# Patient Record
Sex: Male | Born: 1985 | Race: Black or African American | Hispanic: No | Marital: Single | Smoking: Former smoker
Health system: Southern US, Community
[De-identification: ages and names within clinical notes are randomized; demographics above are authoritative.]

## PROBLEM LIST (undated history)

## (undated) DIAGNOSIS — T7840XA Allergy, unspecified, initial encounter: Secondary | ICD-10-CM

## (undated) HISTORY — PX: EYE SURGERY: SHX253

## (undated) HISTORY — DX: Allergy, unspecified, initial encounter: T78.40XA

---
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
## 2009-01-28 ENCOUNTER — Observation Stay (HOSPITAL_COMMUNITY): Admission: EM | Admit: 2009-01-28 | Discharge: 2009-01-29 | Payer: Self-pay | Admitting: Emergency Medicine

## 2009-01-28 ENCOUNTER — Encounter: Payer: Self-pay | Admitting: Emergency Medicine

## 2009-06-01 ENCOUNTER — Emergency Department (HOSPITAL_COMMUNITY): Admission: EM | Admit: 2009-06-01 | Discharge: 2009-06-01 | Payer: Self-pay | Admitting: Emergency Medicine

## 2009-08-02 ENCOUNTER — Emergency Department (HOSPITAL_COMMUNITY): Admission: EM | Admit: 2009-08-02 | Discharge: 2009-08-02 | Payer: Self-pay | Admitting: Emergency Medicine

## 2010-01-03 ENCOUNTER — Emergency Department (HOSPITAL_COMMUNITY): Admission: EM | Admit: 2010-01-03 | Discharge: 2010-01-03 | Payer: Self-pay | Admitting: Emergency Medicine

## 2010-09-08 LAB — URINALYSIS, ROUTINE W REFLEX MICROSCOPIC
Bilirubin Urine: NEGATIVE
Glucose, UA: NEGATIVE mg/dL
Hgb urine dipstick: NEGATIVE
Ketones, ur: NEGATIVE mg/dL
Nitrite: NEGATIVE
Protein, ur: NEGATIVE mg/dL
Specific Gravity, Urine: 1.024 (ref 1.005–1.030)
Urobilinogen, UA: 0.2 mg/dL (ref 0.0–1.0)
pH: 6 (ref 5.0–8.0)

## 2010-09-08 LAB — GC/CHLAMYDIA PROBE AMP, GENITAL
Chlamydia, DNA Probe: NEGATIVE
GC Probe Amp, Genital: POSITIVE — AB

## 2010-09-29 LAB — DIFFERENTIAL
Basophils Absolute: 0 10*3/uL (ref 0.0–0.1)
Basophils Relative: 0 % (ref 0–1)
Eosinophils Absolute: 0 10*3/uL (ref 0.0–0.7)
Eosinophils Relative: 0 % (ref 0–5)
Lymphocytes Relative: 10 % — ABNORMAL LOW (ref 12–46)
Lymphs Abs: 2 10*3/uL (ref 0.7–4.0)
Monocytes Absolute: 1.4 10*3/uL — ABNORMAL HIGH (ref 0.1–1.0)
Monocytes Relative: 7 % (ref 3–12)
Neutro Abs: 15.6 10*3/uL — ABNORMAL HIGH (ref 1.7–7.7)
Neutrophils Relative %: 82 % — ABNORMAL HIGH (ref 43–77)

## 2010-09-29 LAB — RAPID URINE DRUG SCREEN, HOSP PERFORMED
Amphetamines: NOT DETECTED
Barbiturates: NOT DETECTED
Benzodiazepines: NOT DETECTED
Cocaine: NOT DETECTED
Opiates: NOT DETECTED
Tetrahydrocannabinol: POSITIVE — AB

## 2010-09-29 LAB — CBC
HCT: 40.4 % (ref 39.0–52.0)
HCT: 41.7 % (ref 39.0–52.0)
Hemoglobin: 13.6 g/dL (ref 13.0–17.0)
Hemoglobin: 14 g/dL (ref 13.0–17.0)
MCHC: 33.5 g/dL (ref 30.0–36.0)
MCHC: 33.6 g/dL (ref 30.0–36.0)
MCV: 93.8 fL (ref 78.0–100.0)
MCV: 94.5 fL (ref 78.0–100.0)
Platelets: 249 10*3/uL (ref 150–400)
Platelets: 251 10*3/uL (ref 150–400)
RBC: 4.28 MIL/uL (ref 4.22–5.81)
RBC: 4.45 MIL/uL (ref 4.22–5.81)
RDW: 13.1 % (ref 11.5–15.5)
RDW: 13.3 % (ref 11.5–15.5)
WBC: 12.9 10*3/uL — ABNORMAL HIGH (ref 4.0–10.5)
WBC: 19 10*3/uL — ABNORMAL HIGH (ref 4.0–10.5)

## 2010-09-29 LAB — POCT I-STAT, CHEM 8
BUN: 9 mg/dL (ref 6–23)
Calcium, Ion: 1.14 mmol/L (ref 1.12–1.32)
Chloride: 103 mEq/L (ref 96–112)
Creatinine, Ser: 1 mg/dL (ref 0.4–1.5)
Glucose, Bld: 86 mg/dL (ref 70–99)
HCT: 43 % (ref 39.0–52.0)
Hemoglobin: 14.6 g/dL (ref 13.0–17.0)
Potassium: 3.3 mEq/L — ABNORMAL LOW (ref 3.5–5.1)
Sodium: 141 mEq/L (ref 135–145)
TCO2: 26 mmol/L (ref 0–100)

## 2010-09-29 LAB — URINALYSIS, ROUTINE W REFLEX MICROSCOPIC
Bilirubin Urine: NEGATIVE
Glucose, UA: NEGATIVE mg/dL
Hgb urine dipstick: NEGATIVE
Ketones, ur: NEGATIVE mg/dL
Nitrite: NEGATIVE
Protein, ur: NEGATIVE mg/dL
Specific Gravity, Urine: 1.041 — ABNORMAL HIGH (ref 1.005–1.030)
Urobilinogen, UA: 1 mg/dL (ref 0.0–1.0)
pH: 7 (ref 5.0–8.0)

## 2010-09-29 LAB — PROTIME-INR
INR: 1.1 (ref 0.00–1.49)
Prothrombin Time: 14.1 seconds (ref 11.6–15.2)

## 2010-09-29 LAB — ETHANOL: Alcohol, Ethyl (B): <5 mg/dL (ref 0–10)

## 2010-09-29 LAB — APTT: aPTT: 30 seconds (ref 24–37)

## 2010-09-29 LAB — LACTIC ACID, PLASMA: Lactic Acid, Venous: 2.4 mmol/L — ABNORMAL HIGH (ref 0.5–2.2)

## 2010-09-29 LAB — TYPE AND SCREEN
ABO/RH(D): B POS
Antibody Screen: NEGATIVE

## 2010-09-29 LAB — ABO/RH: ABO/RH(D): B POS
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
## 2012-05-23 ENCOUNTER — Emergency Department (HOSPITAL_COMMUNITY)
Admission: EM | Admit: 2012-05-23 | Discharge: 2012-05-23 | Disposition: A | Discharge status: Home / Self Care | Payer: Self-pay | Attending: Emergency Medicine | Admitting: Emergency Medicine

## 2012-05-23 ENCOUNTER — Encounter (HOSPITAL_COMMUNITY): Payer: Self-pay | Admitting: *Deleted

## 2012-05-23 DIAGNOSIS — A64 Unspecified sexually transmitted disease: Secondary | ICD-10-CM

## 2012-05-23 DIAGNOSIS — Z202 Contact with and (suspected) exposure to infections with a predominantly sexual mode of transmission: Secondary | ICD-10-CM | POA: Insufficient documentation

## 2012-05-23 DIAGNOSIS — Z113 Encounter for screening for infections with a predominantly sexual mode of transmission: Secondary | ICD-10-CM | POA: Insufficient documentation

## 2012-05-23 LAB — URINALYSIS, ROUTINE W REFLEX MICROSCOPIC
Bilirubin Urine: NEGATIVE
Glucose, UA: NEGATIVE mg/dL
Ketones, ur: NEGATIVE mg/dL
Protein, ur: NEGATIVE mg/dL
Specific Gravity, Urine: 1.026 (ref 1.005–1.030)
Urobilinogen, UA: 1 mg/dL (ref 0.0–1.0)
pH: 6.5 (ref 5.0–8.0)

## 2012-05-23 LAB — URINE MICROSCOPIC-ADD ON

## 2012-05-23 MED ORDER — LIDOCAINE HCL (PF) 1 % IJ SOLN
INTRAMUSCULAR | Status: AC
  Administered 2012-05-23: 2.5 mL
  Filled 2012-05-23: qty 5

## 2012-05-23 MED ORDER — AZITHROMYCIN 250 MG PO TABS
1000.0000 mg | ORAL_TABLET | Freq: Once | ORAL | Status: AC
  Administered 2012-05-23: 1000 mg via ORAL
  Filled 2012-05-23: qty 4

## 2012-05-23 MED ORDER — DOXYCYCLINE HYCLATE 100 MG PO CAPS: 100.0000 mg | ORAL_CAPSULE | Freq: Two times a day (BID) | ORAL | Status: DC

## 2012-05-23 MED ORDER — CEFTRIAXONE SODIUM 250 MG IJ SOLR
250.0000 mg | Freq: Once | INTRAMUSCULAR | Status: AC
  Administered 2012-05-23: 250 mg via INTRAMUSCULAR
  Filled 2012-05-23: qty 250

## 2012-05-23 NOTE — ED Notes (Signed)
PT ambulated with baseline gait; VSS; A&Ox3; no signs of distress; respirations even and unlabored; skin warm and dry; no questions upon discharge.  

## 2012-05-23 NOTE — ED Notes (Signed)
Pt reports that his partner +chlaymdia and he wants to be checked, denies any symptoms.

## 2012-05-24 NOTE — ED Provider Notes (Signed)
History     CSN: [id number]  Arrival date & time 05/23/12  1154   First MD Initiated Contact with Patient 05/23/12 1209      Chief Complaint  Patient presents with  . Exposure to STD    (Consider location/radiation/quality/duration/timing/severity/associated sxs/prior treatment) Patient is a 26 y.o. male presenting with STD exposure. The history is provided by the patient. No language interpreter was used.  Exposure to STD This is a new problem. The current episode started in the past 7 days. The problem occurs rarely. Pertinent negatives include no abdominal pain, fever, nausea or vomiting. Nothing aggravates the symptoms. He has tried nothing for the symptoms.  26yo male with know exposure to chlamydia from his partner with no symptoms today.  Will treat for stds.    History reviewed. No pertinent past medical history.  History reviewed. No pertinent past surgical history.  History reviewed. No pertinent family history.  History  Substance Use Topics  . Smoking status: Not on file  . Smokeless tobacco: Not on file  . Alcohol Use: No      Review of Systems  Constitutional: Negative.  Negative for fever.  HENT: Negative.   Eyes: Negative.   Respiratory: Negative.   Cardiovascular: Negative.   Gastrointestinal: Negative.  Negative for nausea, vomiting and abdominal pain.  Genitourinary: Negative for dysuria, urgency, hematuria, flank pain, penile swelling, difficulty urinating and penile pain.  Neurological: Negative.   Psychiatric/Behavioral: Negative.   All other systems reviewed and are negative.    Allergies  Review of patient's allergies indicates no known allergies.  Home Medications   Current Outpatient Rx  Name  Route  Sig  Dispense  Refill  . DOXYCYCLINE HYCLATE 100 MG PO CAPS   Oral   Take 1 capsule (100 mg total) by mouth 2 (two) times daily.   20 capsule   0     BP 124/78  Pulse 70  Temp 98.3 F (36.8 C) (Oral)  Resp 20  SpO2  100%  Physical Exam  Nursing note and vitals reviewed. Constitutional: He is oriented to person, place, and time. He appears well-developed and well-nourished.  HENT:  Head: Normocephalic.  Eyes: Conjunctivae normal and EOM are normal. Pupils are equal, round, and reactive to light.  Neck: Normal range of motion. Neck supple.  Cardiovascular: Normal rate.   Pulmonary/Chest: Effort normal.  Abdominal: Soft.  Genitourinary: Testes normal and penis normal. Right testis shows no mass, no swelling and no tenderness. Left testis shows no mass, no swelling and no tenderness. Circumcised. No penile erythema or penile tenderness. No discharge found.  Musculoskeletal: Normal range of motion.  Neurological: He is alert and oriented to person, place, and time.  Skin: Skin is warm and dry.  Psychiatric: He has a normal mood and affect.    ED Course  Procedures (including critical care time)  Labs Reviewed  URINALYSIS, ROUTINE W REFLEX MICROSCOPIC - Abnormal; Notable for the following:    Leukocytes, UA SMALL (*)     All other components within normal limits  URINE MICROSCOPIC-ADD ON  GC/CHLAMYDIA PROBE AMP   No results found.   1. STD (male)       MDM  Known STD exposure with treatment in ER.  [facility] up [facility] in 5-10 days.  No intercourse until recheck.  Advised to use condoms and get checked for HIV.  Understands to return for severe pain,n/v. rx for doxycycline.         Remi Haggard, NP  05/24/12 1254 

## 2012-05-25 LAB — GC/CHLAMYDIA PROBE AMP
CT Probe RNA: POSITIVE — AB
GC Probe RNA: NEGATIVE

## 2012-05-25 NOTE — ED Provider Notes (Signed)
Medical screening examination/treatment/procedure(s) were performed by non-physician practitioner and as supervising physician I was immediately available for consultation/collaboration.  Ethelda Chick, MD 05/25/12 [phone]

## 2012-05-28 NOTE — ED Notes (Signed)
+   Chlamydia Patient treated with Rocephin and Zithromax appended per protocol MD.[facility] letter faxed

## 2012-05-29 ENCOUNTER — Telehealth (HOSPITAL_COMMUNITY): Payer: Self-pay | Admitting: Emergency Medicine

## 2012-05-30 ENCOUNTER — Telehealth (HOSPITAL_COMMUNITY): Payer: Self-pay | Admitting: Emergency Medicine

## 2012-05-30 NOTE — ED Notes (Signed)
Unable to contact patient via phone. Sent letter. °

## 2012-06-09 NOTE — ED Notes (Signed)
Patient called back and was informed of results.  

## 2018-08-11 ENCOUNTER — Other Ambulatory Visit: Payer: Self-pay

## 2018-08-11 ENCOUNTER — Encounter (HOSPITAL_COMMUNITY): Payer: Self-pay | Admitting: Emergency Medicine

## 2018-08-11 ENCOUNTER — Emergency Department (HOSPITAL_COMMUNITY): Payer: Self-pay

## 2018-08-11 ENCOUNTER — Emergency Department (HOSPITAL_COMMUNITY)
Admission: EM | Admit: 2018-08-11 | Discharge: 2018-08-11 | Disposition: A | Discharge status: Home / Self Care | Payer: Self-pay | Attending: Emergency Medicine | Admitting: Emergency Medicine

## 2018-08-11 DIAGNOSIS — R1031 Right lower quadrant pain: Secondary | ICD-10-CM | POA: Insufficient documentation

## 2018-08-11 DIAGNOSIS — Z87891 Personal history of nicotine dependence: Secondary | ICD-10-CM | POA: Insufficient documentation

## 2018-08-11 DIAGNOSIS — R112 Nausea with vomiting, unspecified: Secondary | ICD-10-CM | POA: Insufficient documentation

## 2018-08-11 DIAGNOSIS — R197 Diarrhea, unspecified: Secondary | ICD-10-CM | POA: Insufficient documentation

## 2018-08-11 LAB — COMPREHENSIVE METABOLIC PANEL
ALBUMIN: 4.1 g/dL (ref 3.5–5.0)
ALT: 26 U/L (ref 0–44)
AST: 35 U/L (ref 15–41)
Alkaline Phosphatase: 42 U/L (ref 38–126)
Anion gap: 8 (ref 5–15)
BILIRUBIN TOTAL: 1.6 mg/dL — AB (ref 0.3–1.2)
BUN: 8 mg/dL (ref 6–20)
CO2: 27 mmol/L (ref 22–32)
Calcium: 9.6 mg/dL (ref 8.9–10.3)
Chloride: 106 mmol/L (ref 98–111)
Creatinine, Ser: 0.97 mg/dL (ref 0.61–1.24)
GFR calc Af Amer: >60 mL/min (ref >60)
GFR calc non Af Amer: >60 mL/min (ref >60)
GLUCOSE: 91 mg/dL (ref 70–99)
Potassium: 4 mmol/L (ref 3.5–5.1)
Sodium: 141 mmol/L (ref 135–145)
TOTAL PROTEIN: 6.9 g/dL (ref 6.5–8.1)

## 2018-08-11 LAB — CBC
HCT: 43.6 % (ref 39.0–52.0)
Hemoglobin: 14.5 g/dL (ref 13.0–17.0)
MCH: 29.9 pg (ref 26.0–34.0)
MCHC: 33.3 g/dL (ref 30.0–36.0)
MCV: 89.9 fL (ref 80.0–100.0)
PLATELETS: 312 10*3/uL (ref 150–400)
RBC: 4.85 MIL/uL (ref 4.22–5.81)
RDW: 12.3 % (ref 11.5–15.5)
WBC: 7.7 10*3/uL (ref 4.0–10.5)
nRBC: 0 % (ref 0.0–0.2)

## 2018-08-11 LAB — URINALYSIS, ROUTINE W REFLEX MICROSCOPIC
BILIRUBIN URINE: NEGATIVE
Glucose, UA: NEGATIVE mg/dL
HGB URINE DIPSTICK: NEGATIVE
Ketones, ur: NEGATIVE mg/dL
Leukocytes,Ua: NEGATIVE
Nitrite: NEGATIVE
PROTEIN: NEGATIVE mg/dL
pH: 6 (ref 5.0–8.0)

## 2018-08-11 LAB — LIPASE, BLOOD: Lipase: 41 U/L (ref 11–51)

## 2018-08-11 MED ORDER — ONDANSETRON 4 MG PO TBDP: 4.0000 mg | ORAL_TABLET | Freq: Three times a day (TID) | ORAL | 0 refills | Status: DC | PRN

## 2018-08-11 MED ORDER — SODIUM CHLORIDE 0.9% FLUSH
3.0000 mL | Freq: Once | INTRAVENOUS | Status: AC
  Administered 2018-08-11: 3 mL via INTRAVENOUS

## 2018-08-11 MED ORDER — ONDANSETRON HCL 4 MG/2ML IJ SOLN
4.0000 mg | Freq: Once | INTRAMUSCULAR | Status: AC
  Administered 2018-08-11: 4 mg via INTRAVENOUS
  Filled 2018-08-11: qty 2

## 2018-08-11 MED ORDER — IOHEXOL 300 MG/ML  SOLN
100.0000 mL | Freq: Once | INTRAMUSCULAR | Status: AC | PRN
  Administered 2018-08-11: 100 mL via INTRAVENOUS

## 2018-08-11 MED ORDER — SODIUM CHLORIDE 0.9 % IV BOLUS
1000.0000 mL | Freq: Once | INTRAVENOUS | Status: AC
  Administered 2018-08-11: 1000 mL via INTRAVENOUS

## 2018-08-11 NOTE — ED Triage Notes (Signed)
Pt reports nausea, vomiting, and diarrhea for the past 2 days.

## 2018-08-11 NOTE — ED Notes (Signed)
Patient currently at CT scan .  

## 2018-08-11 NOTE — ED Provider Notes (Addendum)
[facility] EMERGENCY DEPARTMENT Provider Note   CSN: [id number] Arrival date & time: 08/11/18  1718  History   Chief Complaint Chief Complaint  Patient presents with  . Nausea  . Emesis  . Diarrhea    HPI Javier Leonard is a 33 y.o. male with no significant past medical history who presents for evaluation of abdominal pain.  Patient states he has had abdominal pain located to the periumbilical region x2 days.  Patient states he woke up this morning and pain was lower in nature, and the right lower quadrant as well as some mild pain in the left lower quadrant.  Is also had multiple episodes of nonbloody, nonbilious emesis as well as nonbloody diarrhea.  Patient states he has not had an appetite for the last 3 days.  Has only been able to tolerate small sips of Sprite.  Denies previous history of abdominal surgeries.  Denies fever, chills, headache, neck pain, neck stiffness, chest pain, shortness of breath, dysuria.  Patient was seen "while I am here can you run my urine for gonorrhea and chlamydia."  Denies testicular pain, pain with bowel movements, penile discharge or penile lesions.  Denies additional aggravating or alleviating factors.  Abdominal pain PTA was an 8/10, however is now a 2/10. Patient also states he uses marijuana frequently and would like outpatient resource for substance abuse. Denies IVDU or alcohol abuse.  History obtained from patient.  No interpreter was used.     HPI  History reviewed. No pertinent past medical history.  There are no active problems to display for this patient.   History reviewed. No pertinent surgical history.      Home Medications    Prior to Admission medications   Medication Sig Start Date End Date Taking? Authorizing Provider  doxycycline (VIBRAMYCIN) 100 MG capsule Take 1 capsule (100 mg total) by mouth 2 (two) times daily. 05/23/12   Jethro Bastos, NP  ondansetron (ZOFRAN ODT) 4 MG disintegrating tablet Take  1 tablet (4 mg total) by mouth every 8 (eight) hours as needed for nausea or vomiting. 08/11/18   Henderly, Britni A, PA-C    Family History No family history on file.  Social History Social History   Tobacco Use  . Smoking status: Former Games developer  . Smokeless tobacco: Never Used  Substance Use Topics  . Alcohol use: No  . Drug use: No     Allergies   Patient has no known allergies.   Review of Systems Review of Systems  Constitutional: Negative.   HENT: Negative.   Respiratory: Negative.   Cardiovascular: Negative.   Gastrointestinal: Positive for diarrhea, nausea and vomiting. Negative for abdominal distention, abdominal pain, anal bleeding, blood in stool, constipation and rectal pain.  Genitourinary: Negative.   Musculoskeletal: Negative.   Skin: Negative.   Neurological: Negative.   All other systems reviewed and are negative.    Physical Exam Updated Vital Signs BP 120/78   Pulse 63   Temp 98 F (36.7 C) (Oral)   Resp 16   Ht 6\' 2"  (1.88 m)   Wt 74.8 kg   SpO2 (!) 88%   BMI 21.18 kg/m   Physical Exam Vitals signs and nursing note reviewed. Exam conducted with a chaperone present.  Constitutional:      General: He is not in acute distress.    Appearance: He is well-developed. He is not ill-appearing, toxic-appearing or diaphoretic.  HENT:     Head: Normocephalic and atraumatic.  Nose: Nose normal.     Mouth/Throat:     Mouth: Mucous membranes are moist.  Eyes:     Pupils: Pupils are equal, round, and reactive to light.  Neck:     Musculoskeletal: Normal range of motion and neck supple.  Cardiovascular:     Rate and Rhythm: Normal rate and regular rhythm.     Pulses: Normal pulses.     Heart sounds: Normal heart sounds.  Pulmonary:     Effort: Pulmonary effort is normal. No respiratory distress.     Comments: Clear to auscultation bilaterally without wheeze, rhonchi rales.  No accessory muscle usage.  Able to speak in full sentences without  difficulty. Abdominal:     General: There is no distension.     Palpations: Abdomen is soft.     Hernia: There is no hernia in the right inguinal area or left inguinal area.     Comments: Soft without rebound or guarding.  There is tenderness palpation to the right lower quadrant.  Positive psoas, negative obturator and Rovsing sign.  Normoactive bowel sounds.  Genitourinary:    Penis: Normal.      Scrotum/Testes: Normal. Cremasteric reflex is present.     Epididymis:     Right: Normal.     Left: Normal.  Musculoskeletal: Normal range of motion.     Comments: Moves all extremities without difficulty.  Ambulatory in department that difficulty.  Lymphadenopathy:     Lower Body: No right inguinal adenopathy. No left inguinal adenopathy.  Skin:    General: Skin is warm and dry.     Comments: Brisk capillary refill.  Neurological:     Mental Status: He is alert.      ED Treatments / Results  Labs (all labs ordered are listed, but only abnormal results are displayed) Labs Reviewed  COMPREHENSIVE METABOLIC PANEL - Abnormal; Notable for the following components:      Result Value   Total Bilirubin 1.6 (*)    All other components within normal limits  LIPASE, BLOOD  CBC  URINALYSIS, ROUTINE W REFLEX MICROSCOPIC  GC/CHLAMYDIA PROBE AMP ([place]) NOT AT [facility]    EKG None  Radiology Ct Abdomen Pelvis W Contrast  Result Date: 08/11/2018 CLINICAL DATA:  33 y/o M; nausea, vomiting, and diarrhea for 2 days. EXAM: CT ABDOMEN AND PELVIS WITH CONTRAST TECHNIQUE: Multidetector CT imaging of the abdomen and pelvis was performed using the standard protocol following bolus administration of intravenous contrast. CONTRAST:  OMNIPAQUE IOHEXOL 300 MG/ML  SOLN COMPARISON:  01/28/2009 CT abdomen and pelvis. FINDINGS: Lower chest: Right lower lobe 3 mm perifissural nodule compatible with intrapulmonary lymph node. Hepatobiliary: No focal liver abnormality is seen. No gallstones, gallbladder  wall thickening, or biliary dilatation. Pancreas: Unremarkable. No pancreatic ductal dilatation or surrounding inflammatory changes. Spleen: Normal in size without focal abnormality. Adrenals/Urinary Tract: Adrenal glands are unremarkable. 7 mm cyst in the lower pole of right kidney is stable. Otherwise kidneys are normal, without renal calculi, focal lesion, or hydronephrosis. Bladder is unremarkable. Stomach/Bowel: Stomach is within normal limits. Appendix not identified, no pericecal inflammation. No evidence of bowel wall thickening, distention, or inflammatory changes. Vascular/Lymphatic: No significant vascular findings are present. No enlarged abdominal or pelvic lymph nodes. Reproductive: Prostate is unremarkable. Other: No abdominal wall hernia or abnormality. No abdominopelvic ascites. Musculoskeletal: No acute or significant osseous findings. IMPRESSION: No acute process identified.  Unremarkable CT of abdomen and pelvis. Electronically Signed   By: Mitzi Hansen M.D.   On:  08/11/2018 19:55    Procedures Procedures (including critical care time)  Medications Ordered in ED Medications  sodium chloride flush (NS) 0.9 % injection 3 mL (3 mLs Intravenous Given 08/11/18 1910)  sodium chloride 0.9 % bolus 1,000 mL (0 mLs Intravenous Stopped 08/11/18 2107)  ondansetron (ZOFRAN) injection 4 mg (4 mg Intravenous Given 08/11/18 1910)  iohexol (OMNIPAQUE) 300 MG/ML solution 100 mL (100 mLs Intravenous Contrast Given 08/11/18 1933)     Initial Impression / Assessment and Plan / ED Course  I have reviewed the triage vital signs and the nursing notes.  Pertinent labs & imaging results that were available during my care of the patient were reviewed by me and considered in my medical decision making (see chart for details).  33 year old male who appears otherwise well presents for evaluation of abdominal pain.  Afebrile, nonseptic, non-ill-appearing.  Mucous membranes moist.  Abdominal  tenderness to right lower quadrant, positive psoas, negative obturator Rovsing sign.  No rebound or guarding.  Pain a 2/10, does not want anything for pain at this time.  Is also had multiple episodes of nonbloody, nonbilious emesis and nonbloody diarrhea.  No recent travel or antibiotic use.  Possible gastroenteritis versus early appendicitis.  Labs ordered from triage.  BC without leukocytosis, lipase 41, metabolic panel with mild elevation in bilirubin, normal LFTs, no renal, liver, electrolyte abnormalities.  Patient requesting urine to be tested for gonorrhea and chlamydia.  No pain with bowel movements, testicular pain to indicate prostatitis, orchitis.  Declines empiric therapy for gonorrhea and chlamydia, does not want HIV and syphilis testing.  Give fluids, antiemetics, CT scan to r/o appendicitis and reevaluate.  Also requesting detox programs for marijuana.  Will give resources at discharge.  2000: CT scan negative for acute pathology.  Patient with improvement in nausea after Zofran and fluids.  Patient able to tolerate p.o. intake in department that difficulty.  Patient unable to provide urine sample.  We will continue to monitor.  On reevaluation abdomen soft, nontender without rebound or guarding.  Low suspicion for acute emergent pathology at this time.  Patient with documented oxygen saturation 88% in room,however I evaluated patient with 100% percent oxygen saturation on RA, do not feel this number is accurate.   2100: Patient is nontoxic, nonseptic appearing, in no apparent distress.  Patient's pain and other symptoms adequately managed in emergency department.  Fluid bolus given.  Labs, imaging and vitals reviewed.  Patient does not meet the SIRS or Sepsis criteria.  On repeat exam patient does not have a surgical abdomin and there are no peritoneal signs.  No indication of appendicitis, bowel obstruction, bowel perforation, cholecystitis, diverticulitis.  Patient able to tolerate p.o. intake  in department without difficulty.  Urinalysis pending at discharge.  Patient also having GC, chlamydia screen from urine.  Patient understands that these results will take 24 to 48 hours to return.  Does not want empiric treatment at this time.  If urinalysis negative patient likely to DC home with Zofran for nausea and vomiting.  I have discussed BRAT diet with patient.  Low suspicion for acute emergent pathology at this time.  Care transferred to Dr. Rodena Medin at shift change pending urinalysis.  Hemodynamically stable at this time.     Final Clinical Impressions(s) / ED Diagnoses   Final diagnoses:  Right lower quadrant abdominal pain  Nausea vomiting and diarrhea    ED Discharge Orders         Ordered    ondansetron (ZOFRAN ODT) 4  MG disintegrating tablet  Every 8 hours PRN     08/11/18 2030             Henderly, Britni A, PA-C 08/11/18 2110    Wynetta FinesMessick, Peter C, MD 08/19/18 [phone]

## 2018-08-11 NOTE — Discharge Instructions (Addendum)
You were evaluated today for abdominal pain, vomiting and diarrhea.  Your laboratory work was unremarkable in department.  Your CT scan was negative.  This is likely a viral infection.  I have given you Zofran for your vomiting.  This is a disintegrating tablet that you have placed under your tongue.  Please take as needed.  I would start out with a clear liquid diet and gradually increase to bland foods over the next couple days.  Please make sure to adequately hydrate you do not become dehydrated.  Also given you resources for detox from marijuana as you have asked.  Return to the emergency department for any worsening symptoms.

## 2018-08-13 LAB — GC/CHLAMYDIA PROBE AMP (~~LOC~~) NOT AT ARMC
CHLAMYDIA, DNA PROBE: NEGATIVE
NEISSERIA GONORRHEA: NEGATIVE

## 2020-07-06 IMAGING — CT CT ABD-PELV W/ CM
2 of 4 series · 16 of 46 positions shown, 18 images · IV contrast (APPLIED)
Comparison: 01/28/2009 CT abdomen and pelvis.

CLINICAL DATA: 32 y/o M; nausea, vomiting, and diarrhea for 2 days.

EXAM:
CT ABDOMEN AND PELVIS WITH CONTRAST
TECHNIQUE: Multidetector CT imaging of the abdomen and pelvis was performed
using the standard protocol following bolus administration of
intravenous contrast.
CONTRAST:  100mL OMNIPAQUE IOHEXOL 300 MG/ML  SOLN

[Series 3: abd/ pelvis 5.0 i30f 2 · axial · 0.65mm/px · z∈[+909,+1294]mm · 13 of 85 slices shown, 15 images]
[im 4/85  soft-tissue]
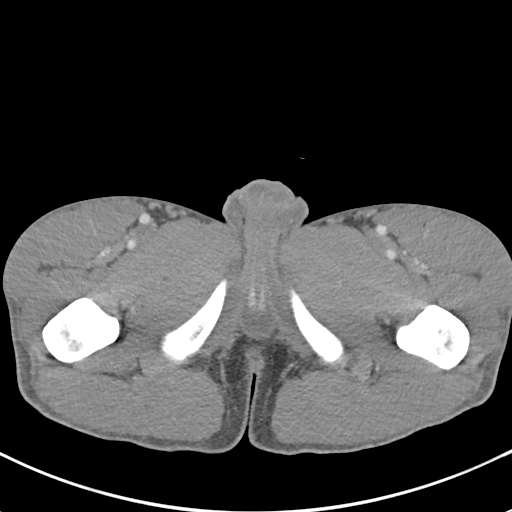
[im 4/85  bone]
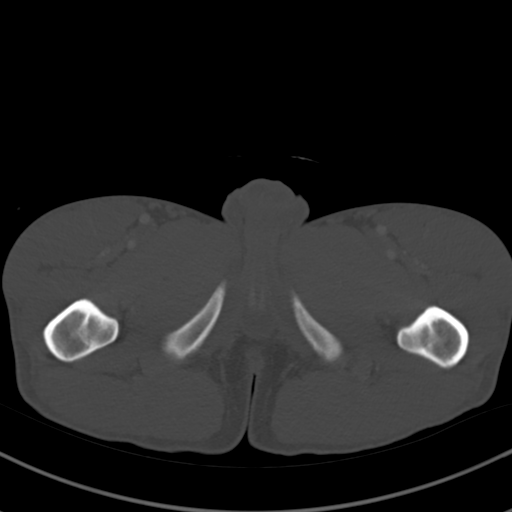
[im 11/85  soft-tissue]
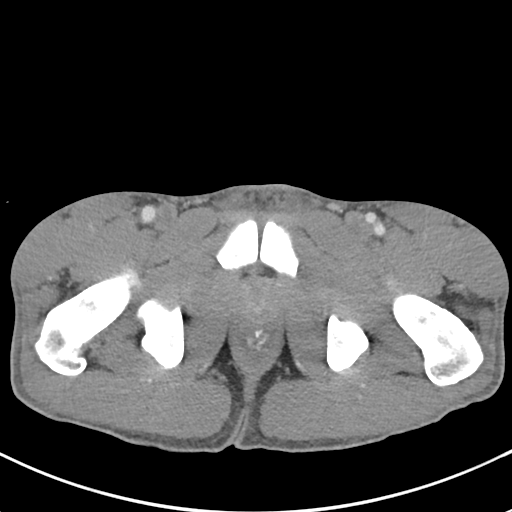
[im 17/85  soft-tissue]
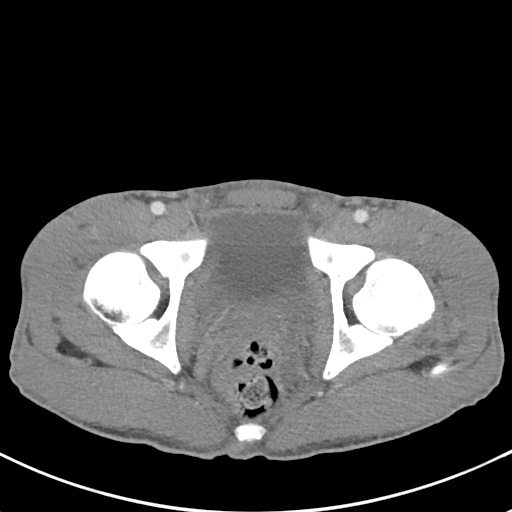
[im 24/85  soft-tissue]
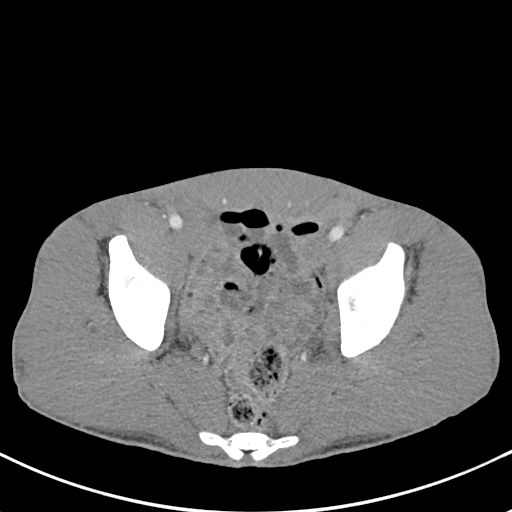
[im 31/85  soft-tissue]
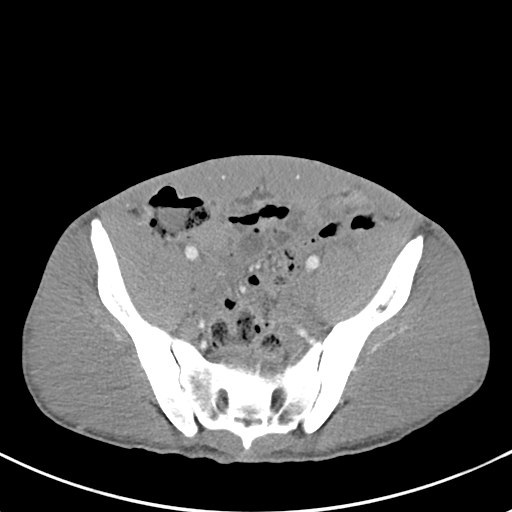
[im 37/85  soft-tissue]
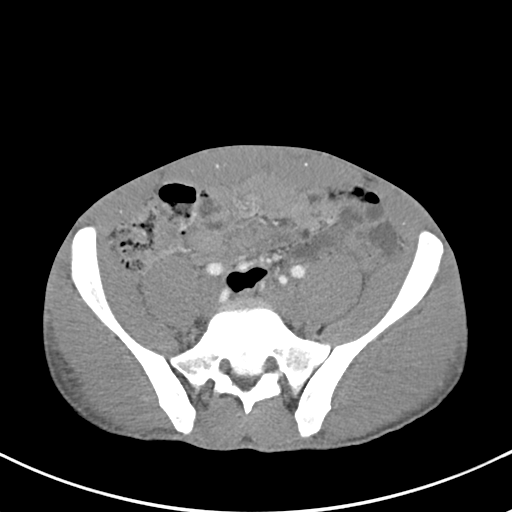
[im 44/85  soft-tissue]
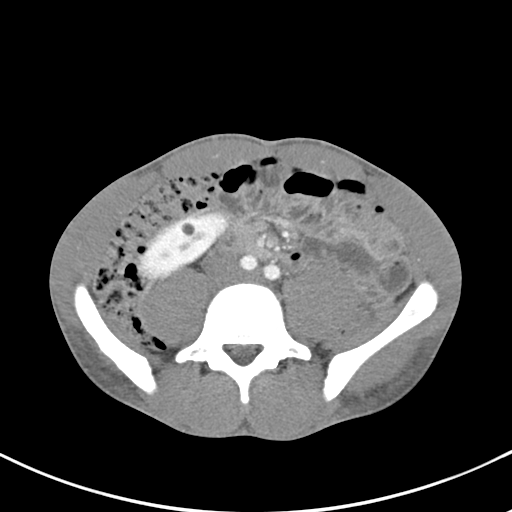
[im 48/85  soft-tissue]
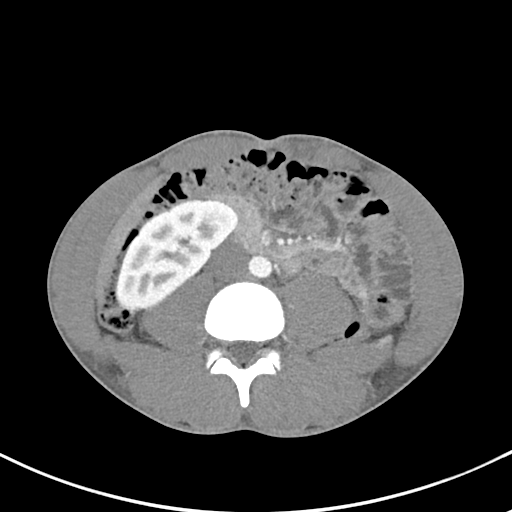
[im 54/85  soft-tissue]
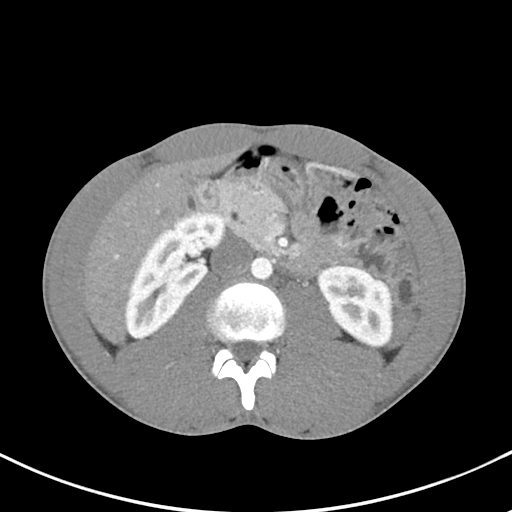
[im 54/85  bone]
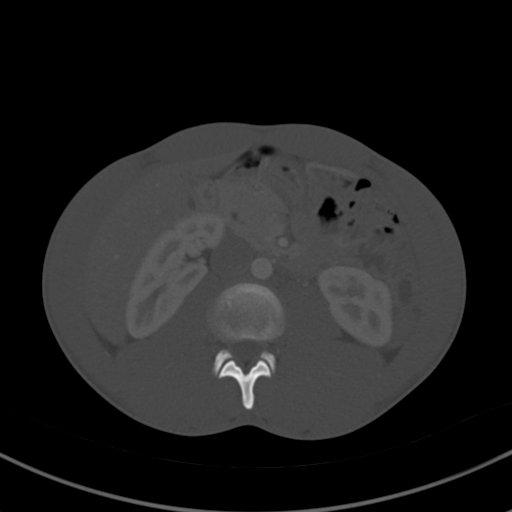
[im 61/85  soft-tissue]
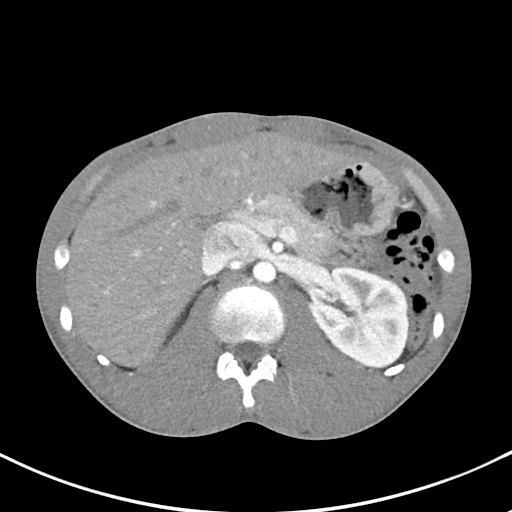
[im 68/85  soft-tissue]
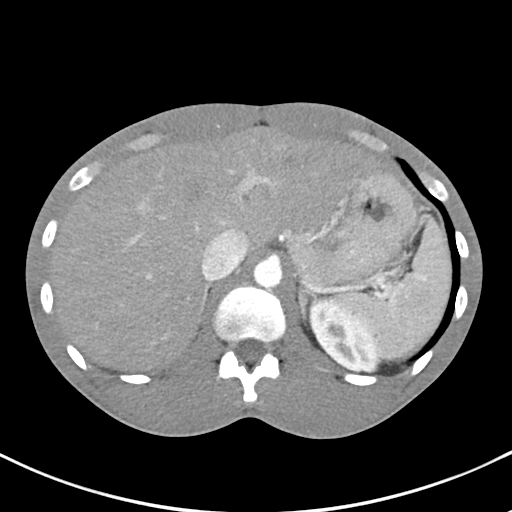
[im 74/85  soft-tissue]
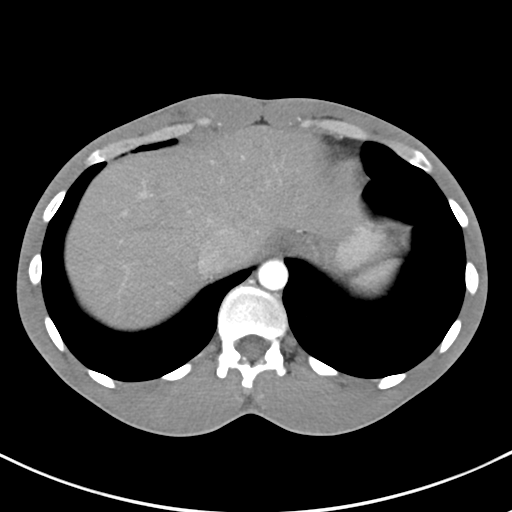
[im 81/85  soft-tissue]
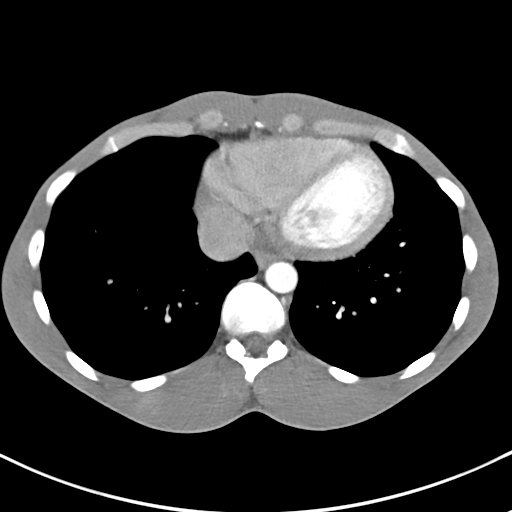

[Series 6: coronal soft tissue · coronal · 0.69mm/px · 3 of 92 slices shown]
[im 31/92  soft-tissue]
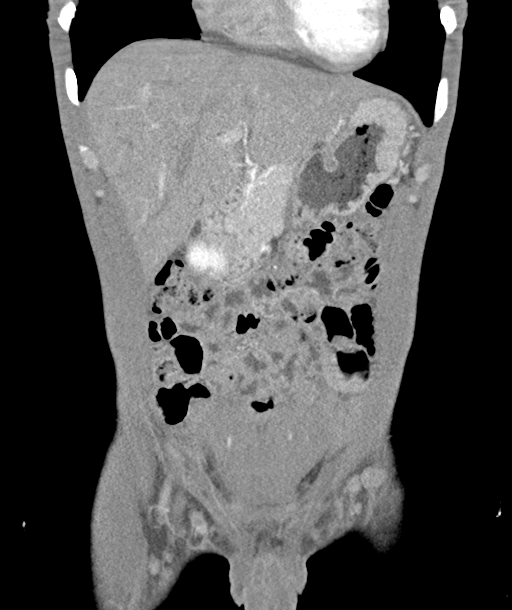
[im 41/92  soft-tissue]
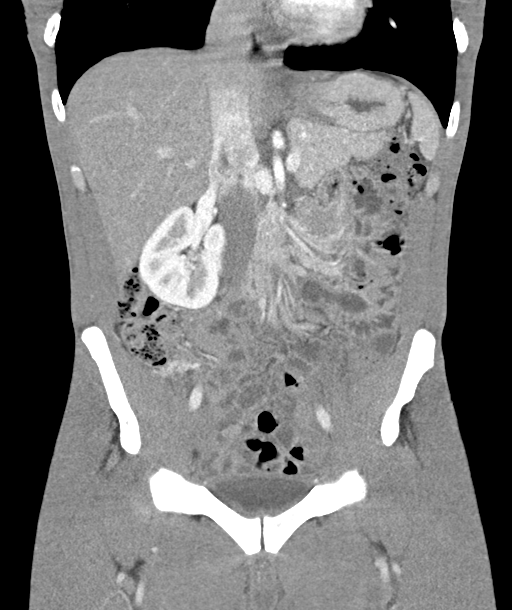
[im 51/92  soft-tissue]
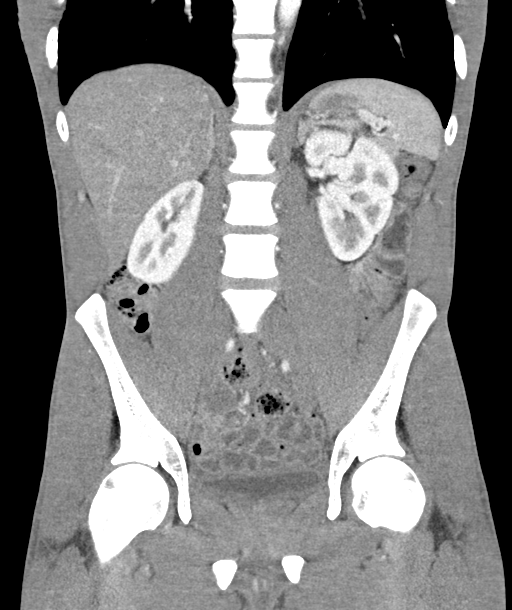

[16 of 46 positions shown; findings below may reference images not displayed]

FINDINGS: Lower chest: Right lower lobe 3 mm perifissural nodule compatible
with intrapulmonary lymph node.

Hepatobiliary: No focal liver abnormality is seen. No gallstones,
gallbladder wall thickening, or biliary dilatation.

Pancreas: Unremarkable. No pancreatic ductal dilatation or
surrounding inflammatory changes.

Spleen: Normal in size without focal abnormality.

Adrenals/Urinary Tract: Adrenal glands are unremarkable. 7 mm cyst
in the lower pole of right kidney is stable. Otherwise kidneys are
normal, without renal calculi, focal lesion, or hydronephrosis.
Bladder is unremarkable.

Stomach/Bowel: Stomach is within normal limits. Appendix not
identified, no pericecal inflammation. No evidence of bowel wall
thickening, distention, or inflammatory changes.

Vascular/Lymphatic: No significant vascular findings are present. No
enlarged abdominal or pelvic lymph nodes.

Reproductive: Prostate is unremarkable.

Other: No abdominal wall hernia or abnormality. No abdominopelvic
ascites.

Musculoskeletal: No acute or significant osseous findings.
IMPRESSION: No acute process identified.  Unremarkable CT of abdomen and pelvis.

## 2021-04-03 ENCOUNTER — Other Ambulatory Visit: Payer: Self-pay

## 2021-04-03 ENCOUNTER — Encounter (HOSPITAL_COMMUNITY): Payer: Self-pay

## 2021-04-03 ENCOUNTER — Emergency Department (HOSPITAL_COMMUNITY): Payer: Self-pay

## 2021-04-03 ENCOUNTER — Emergency Department (HOSPITAL_COMMUNITY)
Admission: EM | Admit: 2021-04-03 | Discharge: 2021-04-03 | Disposition: A | Discharge status: Home / Self Care | Payer: Self-pay | Attending: Emergency Medicine | Admitting: Emergency Medicine

## 2021-04-03 DIAGNOSIS — M25531 Pain in right wrist: Secondary | ICD-10-CM | POA: Insufficient documentation

## 2021-04-03 DIAGNOSIS — G5603 Carpal tunnel syndrome, bilateral upper limbs: Secondary | ICD-10-CM | POA: Insufficient documentation

## 2021-04-03 DIAGNOSIS — Z87891 Personal history of nicotine dependence: Secondary | ICD-10-CM | POA: Insufficient documentation

## 2021-04-03 NOTE — ED Provider Notes (Signed)
[facility]-EMERGENCY DEPT Provider Note   CSN: [id number] Arrival date & time: 04/03/21  1614     History Chief Complaint  Patient presents with   hand numbness   Hand Pain   Wrist Pain    Javier Leonard is a 35 y.o. male with no pertinent past medical history who presents today for evaluation of bilateral hand numbness.  This has been intermittent over the past few years however has become worse recently.  He reports he does repetitive hand movements for work. He denies any fevers or specific injury.  His pain is worse in the morning when he wakes up.  Pain is worse in the right thumb.   HPI     History reviewed. No pertinent past medical history.  There are no problems to display for this patient.   Past Surgical History:  Procedure Laterality Date   EYE SURGERY         History reviewed. No pertinent family history.  Social History   Tobacco Use   Smoking status: Former   Smokeless tobacco: Never  Building services engineer Use: Never used  Substance Use Topics   Alcohol use: No   Drug use: No    Home Medications Prior to Admission medications   Medication Sig Start Date End Date Taking? Authorizing Provider  ondansetron (ZOFRAN ODT) 4 MG disintegrating tablet Take 1 tablet (4 mg total) by mouth every 8 (eight) hours as needed for nausea or vomiting. 08/11/18   Henderly, Britni A, PA-C    Allergies    Patient has no known allergies.  Review of Systems   Review of Systems  Constitutional:  Negative for chills and fever.  Eyes:  Negative for visual disturbance.  Neurological:  Positive for numbness.  All other systems reviewed and are negative.  Physical Exam Updated Vital Signs BP 123/70   Pulse 86   Temp 98.9 F (37.2 C) (Oral)   Resp 18   Ht 6\' 2"  (1.88 m)   Wt 74.8 kg   SpO2 99%   BMI 21.18 kg/m   Physical Exam Vitals and nursing note reviewed.  Constitutional:      General: He is not in acute distress. HENT:      Head: Normocephalic and atraumatic.  Cardiovascular:     Rate and Rhythm: Normal rate.     Comments: 2+ radial pulses bilaterally. Pulmonary:     Effort: Pulmonary effort is normal. No respiratory distress.  Musculoskeletal:     Cervical back: No rigidity.     Comments: Patient has full active range of motion of fingers on bilateral hands.   Pain with percussion over the carpal tunnel.   Skin:    Comments: Warm and dry  Neurological:     Mental Status: He is alert. Mental status is at baseline.     Sensory: Sensory deficit (Decree sensation to light touch over fingers thumb, index, long and radial aspect of the ring finger bilaterally) present.     Motor: No weakness.     Comments: Awake and alert, answers all questions appropriately.  Speech is not slurred.    Psychiatric:        Mood and Affect: Mood normal.        Behavior: Behavior normal.    ED Results / Procedures / Treatments   Labs (all labs ordered are listed, but only abnormal results are displayed) Labs Reviewed - No data to display  EKG None  Radiology DG Hand Complete Right  Result Date: 04/03/2021 CLINICAL DATA:  Hand pain for awhile, repetitive job motion EXAM: RIGHT HAND - COMPLETE 3+ VIEW COMPARISON:  None FINDINGS: Osseous mineralization normal. Joint spaces preserved. No acute fracture, dislocation, or bone destruction. IMPRESSION: Normal exam. Electronically Signed   By: Ulyses Southward M.D.   On: 04/03/2021 18:14    Procedures Procedures   Medications Ordered in ED Medications - No data to display  ED Course  I have reviewed the triage vital signs and the nursing notes.  Pertinent labs & imaging results that were available during my care of the patient were reviewed by me and considered in my medical decision making (see chart for details).    MDM Rules/Calculators/A&P                          Patient is a 35 year old man who presents today for evaluation of pain and numbness in his bilateral hands.   This has been present for about 2 years however is worsened significantly recently.  He denies any fevers or injury.  His pain is consistent with carpal tunnel syndrome.  He will be provided braces with instructions to use these at night every night and during the day as needed.  Recommended hand follow-up.  OTC meds as needed for pain including NSAIDs.  Note: Portions of this report may have been transcribed using voice recognition software. Every effort was made to ensure accuracy; however, inadvertent computerized transcription errors may be present   Final Clinical Impression(s) / ED Diagnoses Final diagnoses:  Bilateral carpal tunnel syndrome    Rx / DC Orders ED Discharge Orders     None        Norman Clay 04/03/21 1941    Linwood Dibbles, MD 04/04/21 1429

## 2021-04-03 NOTE — Discharge Instructions (Addendum)
Please wear the braces every night to help keep yourself from bending your wrist during your sleep. You may choose to wear them during the day also to see if that helps. As we discussed I highly recommend that you see a hand specialist to see if you require surgery.  Please take Ibuprofen (Advil, motrin) and Tylenol (acetaminophen) to relieve your pain.    You may take up to 600 MG (3 pills) of normal strength ibuprofen every 8 hours as needed.   You make take tylenol, up to 1,000 mg (two extra strength pills) every 8 hours as needed.   It is safe to take ibuprofen and tylenol at the same time as they work differently.   Do not take more than 3,000 mg tylenol in a 24 hour period (not more than one dose every 8 hours.  Please check all medication labels as many medications such as pain and cold medications may contain tylenol.  Do not drink alcohol while taking these medications.  Do not take other NSAID'S while taking ibuprofen (such as aleve or naproxen).  Please take ibuprofen with food to decrease stomach upset.

## 2021-04-03 NOTE — ED Triage Notes (Signed)
Patient reports that he has had intermittent bilateral hand numbness and pain x 2 years. Patient sates he has always had to do repetitive movement with his hands for his job. Patient states the pain has worsened in the past few days. Patient states his right thumb is very painful today.

## 2021-04-03 NOTE — ED Provider Notes (Signed)
Emergency Medicine Provider Triage Evaluation Note  Javier Leonard , a 35 y.o. male  was evaluated in triage.  Pt complains of intermittent hand pain and numbness.  He has had this intermittently for the past two years.  He done repetitive movements with his hands for work.  He says that his right thumb has been extra painful today.    Review of Systems  Positive: Pain and numbness in bilateral hands Negative: Fevers  Physical Exam  BP 123/70   Pulse 86   Temp 98.9 F (37.2 C) (Oral)   Resp 18   Ht 6\' 2"  (1.88 m)   Wt 74.8 kg   SpO2 99%   BMI 21.18 kg/m  Gen:   Awake, no distress   Resp:  Normal effort  MSK:   Moves extremities without difficulty  Other:  Pain with movements of the right hand and thumb.  TTP over carpal tunnel bilaterally.   Medical Decision Making  Medically screening exam initiated at 5:28 PM.  Appropriate orders placed.  Javier Leonard was informed that the remainder of the evaluation will be completed by another provider, this initial triage assessment does not replace that evaluation, and the importance of remaining in the ED until their evaluation is complete.     Javier Leonard 04/03/21 1736    06/03/21, MD 04/03/21 06/03/21

## 2021-04-14 ENCOUNTER — Encounter (HOSPITAL_COMMUNITY): Payer: Self-pay | Admitting: Emergency Medicine

## 2021-04-14 ENCOUNTER — Emergency Department (HOSPITAL_COMMUNITY)
Admission: EM | Admit: 2021-04-14 | Discharge: 2021-04-14 | Disposition: A | Discharge status: Home / Self Care | Payer: Self-pay | Attending: Emergency Medicine | Admitting: Emergency Medicine

## 2021-04-14 DIAGNOSIS — G5603 Carpal tunnel syndrome, bilateral upper limbs: Secondary | ICD-10-CM | POA: Insufficient documentation

## 2021-04-14 DIAGNOSIS — Z87891 Personal history of nicotine dependence: Secondary | ICD-10-CM | POA: Insufficient documentation

## 2021-04-14 MED ORDER — PREDNISONE 10 MG PO TABS: 40.0000 mg | ORAL_TABLET | Freq: Every day | ORAL | 0 refills | Status: DC

## 2021-04-14 NOTE — ED Triage Notes (Signed)
States he has bilateral wrist pain, needs to schedule carpal tunnel sx. Has been taking Naproxen every few hours, states he needs something stronger.

## 2021-04-14 NOTE — ED Provider Notes (Signed)
[facility]-EMERGENCY DEPT Provider Note   CSN: [id number] Arrival date & time: 04/14/21  1805     History No chief complaint on file.   Javier Leonard is a 35 y.o. male who presents to the emergency department with bilateral wrist pain and for second and third numbness to the bilateral hands that has been chronic for some time.  Patient was seen and evaluated here on 12 October and was diagnosed with bilateral carpal tunnels.  He was instructed to follow-up with orthopedics for possible surgical evaluation.  He is fearful as he been taking a lot of anti-inflammatories and wants to ensure that this is okay and wants additional pain medication.  He denies any new symptoms, fever, chills, or injury to the wrist.  HPI     History reviewed. No pertinent past medical history.  There are no problems to display for this patient.   Past Surgical History:  Procedure Laterality Date   EYE SURGERY         No family history on file.  Social History   Tobacco Use   Smoking status: Former   Smokeless tobacco: Never  Building services engineer Use: Never used  Substance Use Topics   Alcohol use: No   Drug use: No    Home Medications Prior to Admission medications   Medication Sig Start Date End Date Taking? Authorizing Provider  predniSONE (DELTASONE) 10 MG tablet Take 4 tablets (40 mg total) by mouth daily. 04/14/21  Yes Meredeth Ide, Arina Torry M, PA-C  ondansetron (ZOFRAN ODT) 4 MG disintegrating tablet Take 1 tablet (4 mg total) by mouth every 8 (eight) hours as needed for nausea or vomiting. 08/11/18   Henderly, Britni A, PA-C    Allergies    Patient has no known allergies.  Review of Systems   Review of Systems  All other systems reviewed and are negative.  Physical Exam Updated Vital Signs BP 105/60 (BP Location: Left Arm)   Pulse 93   Temp 98.2 F (36.8 C) (Oral)   Resp 15   SpO2 98%   Physical Exam Vitals and nursing note reviewed.  Constitutional:       Appearance: Normal appearance.  HENT:     Head: Normocephalic and atraumatic.  Eyes:     General:        Right eye: No discharge.        Left eye: No discharge.     Conjunctiva/sclera: Conjunctivae normal.  Pulmonary:     Effort: Pulmonary effort is normal.  Musculoskeletal:     Comments: Bilateral wrists are and splints.  Subjective numbness to the first second and third fingers bilaterally.  Hands have good capillary refill.  Skin:    General: Skin is warm and dry.     Findings: No rash.  Neurological:     General: No focal deficit present.     Mental Status: He is alert.  Psychiatric:        Mood and Affect: Mood normal.        Behavior: Behavior normal.    ED Results / Procedures / Treatments   Labs (all labs ordered are listed, but only abnormal results are displayed) Labs Reviewed - No data to display  EKG None  Radiology No results found.  Procedures Procedures   Medications Ordered in ED Medications - No data to display  ED Course  I have reviewed the triage vital signs and the nursing notes.  Pertinent labs & imaging results that were  available during my care of the patient were reviewed by me and considered in my medical decision making (see chart for details).    MDM Rules/Calculators/A&P                          Javier Leonard is a 35 y.o. male presents the emergency department for for evaluation of bilateral carpal tunnel syndrome.  Given that this is not acutely changed or worsened since his last visit I do not feel additional imaging or work-up is necessary at this time.  I will prescribe the patient on steroids for 5 days and attempt to bring down inflammation as he is still quite symptomatic.  His plan is to follow-up with orthopedics sometime next week.  He will call them tomorrow.  I also instructed the patient that the anti-inflammatories are of benefit and he can continue taking them as long as he does not have any other associated symptoms.   All questions or concerns addressed.   Final Clinical Impression(s) / ED Diagnoses Final diagnoses:  Bilateral carpal tunnel syndrome    Rx / DC Orders ED Discharge Orders          Ordered    predniSONE (DELTASONE) 10 MG tablet  Daily        04/14/21 1847             Teressa Lower, PA-C 04/14/21 1849    Sloan Leiter, DO 04/14/21 2343

## 2023-02-27 IMAGING — CR DG HAND COMPLETE 3+V*R*
3 series · 3 of 3 positions shown · non-contrast
Comparison: None

CLINICAL DATA: Hand pain for awhile, repetitive job motion

EXAM:
RIGHT HAND - COMPLETE 3+ VIEW

[x hand pa right]
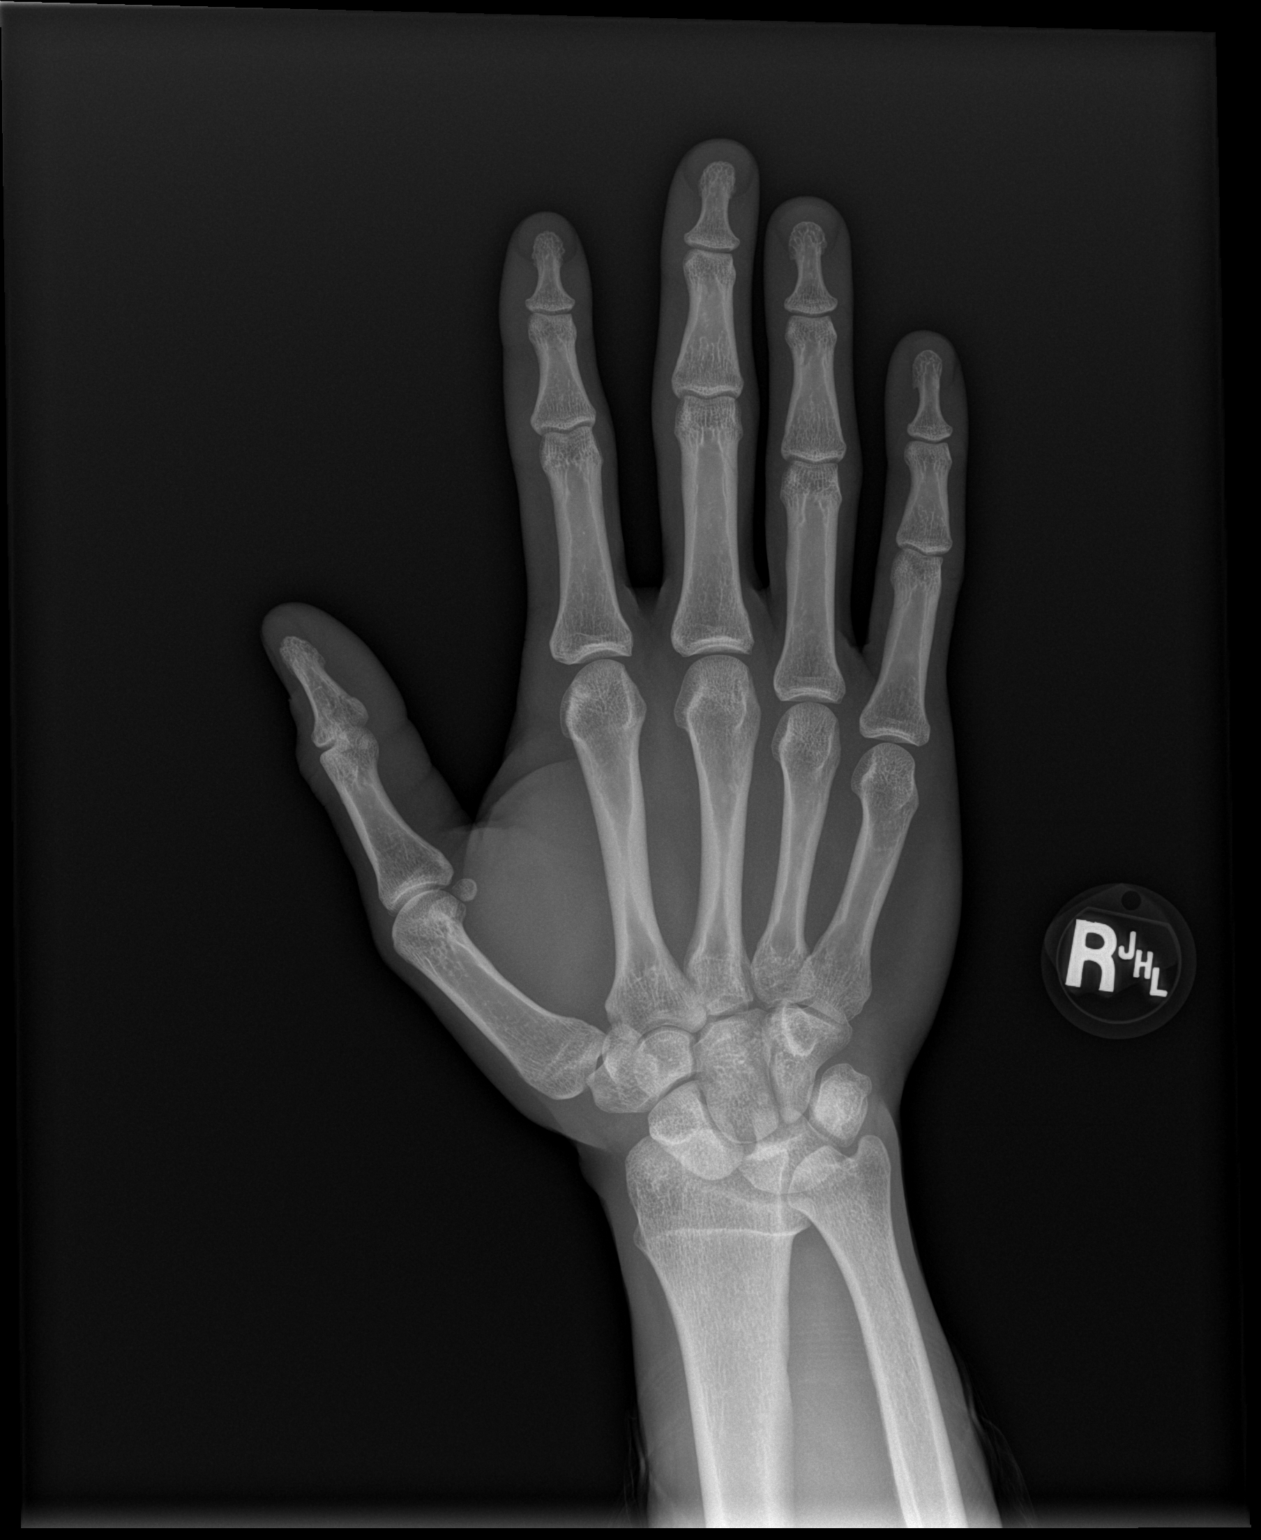

[x hand obl right]
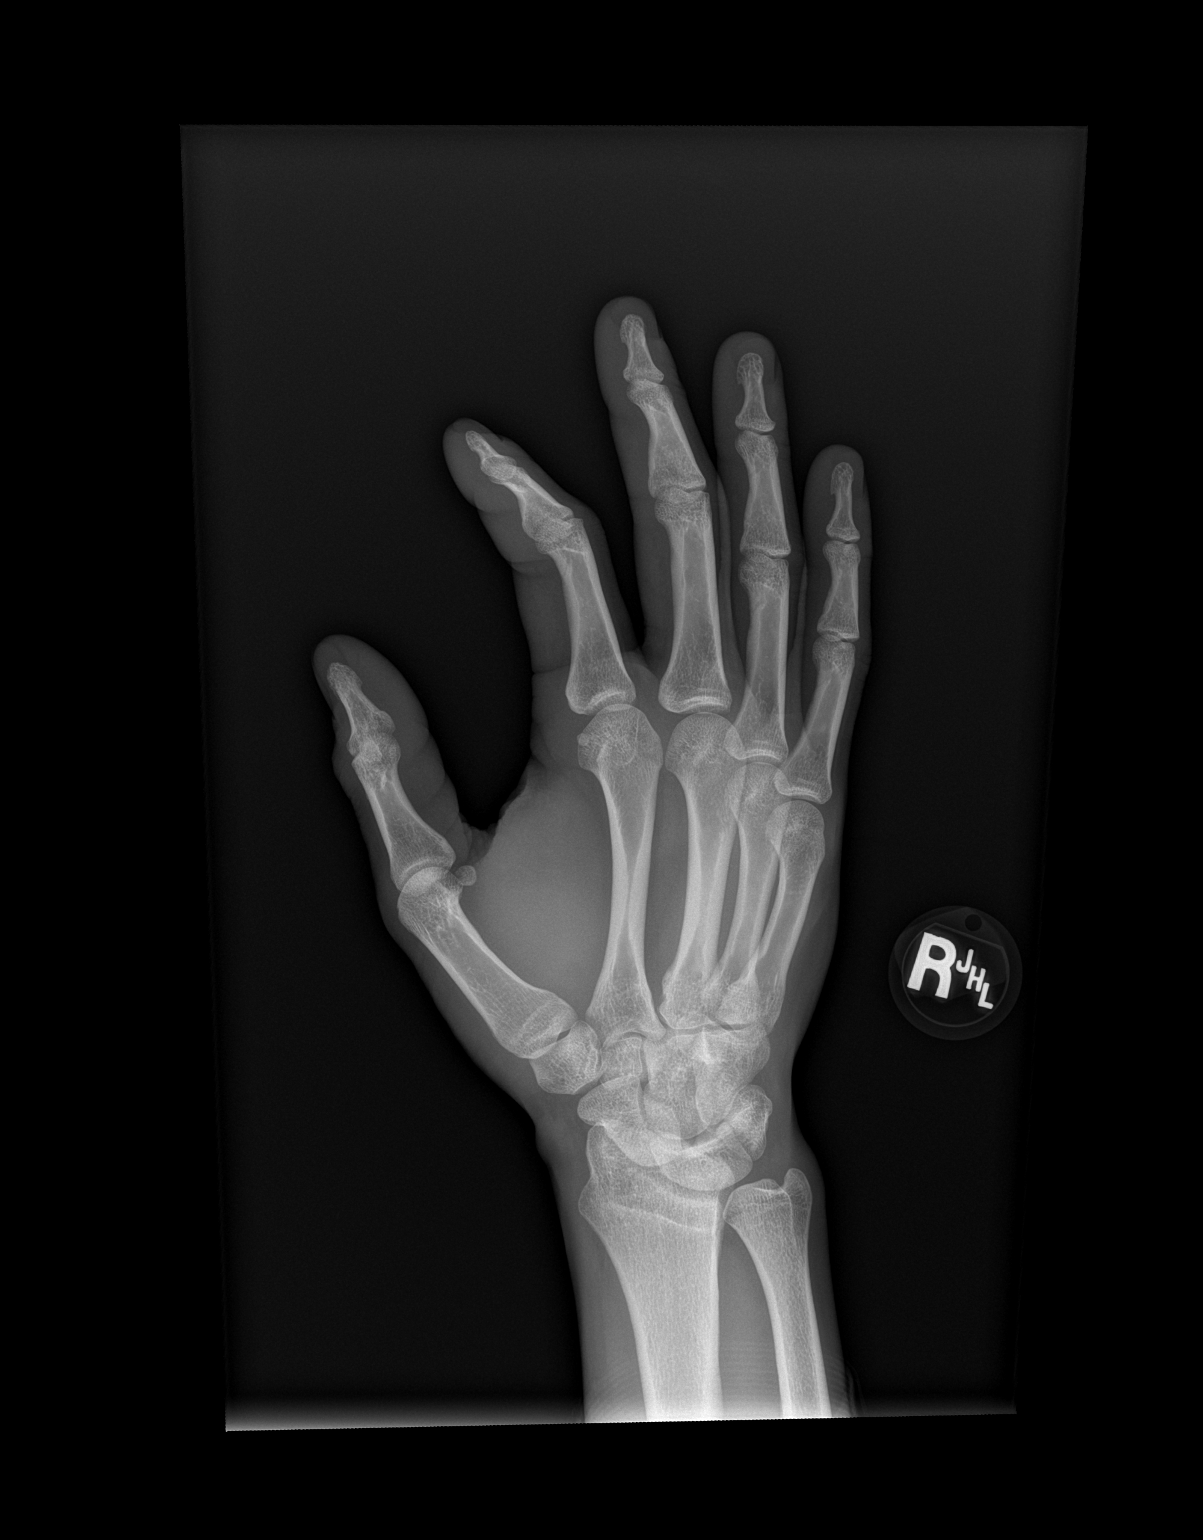

[x hand lat right]
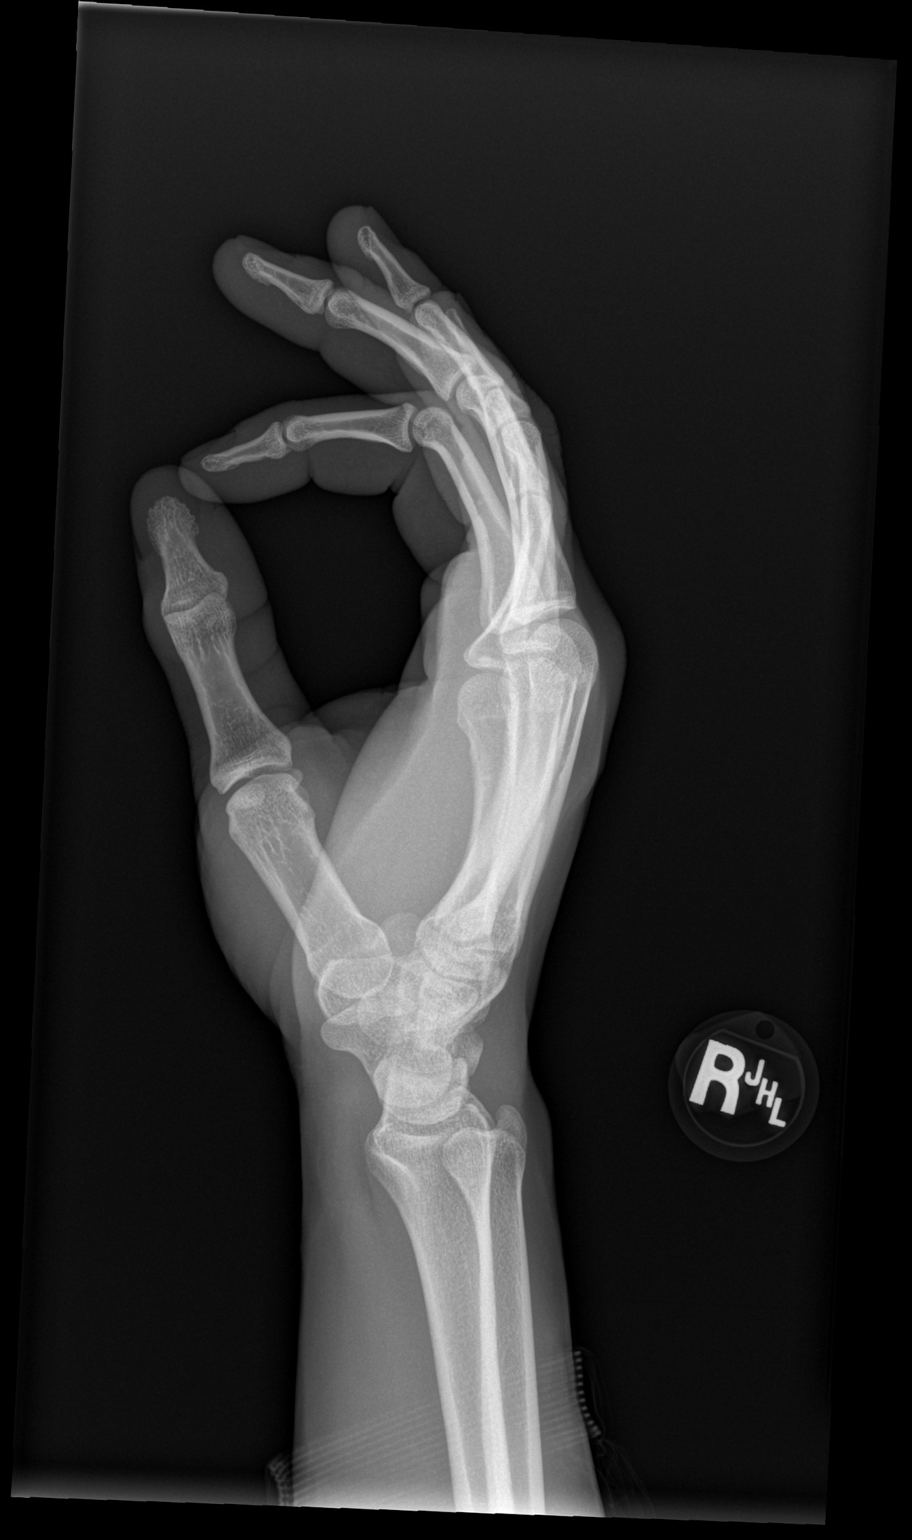

[3 of 3 positions shown; findings below may reference images not displayed]

FINDINGS: Osseous mineralization normal.

Joint spaces preserved.

No acute fracture, dislocation, or bone destruction.
IMPRESSION: Normal exam.

## 2023-11-27 ENCOUNTER — Encounter: Payer: Self-pay | Admitting: Family Medicine

## 2023-11-27 ENCOUNTER — Ambulatory Visit: Payer: Self-pay | Admitting: Family Medicine

## 2023-11-27 VITALS — BP 108/64 | HR 106 | Temp 98.9°F | Ht 74.0 in | Wt 159.0 lb

## 2023-11-27 DIAGNOSIS — Z23 Encounter for immunization: Secondary | ICD-10-CM

## 2023-11-27 DIAGNOSIS — Z114 Encounter for screening for human immunodeficiency virus [HIV]: Secondary | ICD-10-CM

## 2023-11-27 DIAGNOSIS — Z7689 Persons encountering health services in other specified circumstances: Secondary | ICD-10-CM

## 2023-11-27 DIAGNOSIS — F411 Generalized anxiety disorder: Secondary | ICD-10-CM | POA: Diagnosis not present

## 2023-11-27 DIAGNOSIS — Z1159 Encounter for screening for other viral diseases: Secondary | ICD-10-CM

## 2023-11-27 DIAGNOSIS — Z136 Encounter for screening for cardiovascular disorders: Secondary | ICD-10-CM | POA: Diagnosis not present

## 2023-11-27 DIAGNOSIS — Z113 Encounter for screening for infections with a predominantly sexual mode of transmission: Secondary | ICD-10-CM | POA: Diagnosis not present

## 2023-11-27 DIAGNOSIS — F333 Major depressive disorder, recurrent, severe with psychotic symptoms: Secondary | ICD-10-CM | POA: Diagnosis not present

## 2023-11-27 DIAGNOSIS — Z0000 Encounter for general adult medical examination without abnormal findings: Secondary | ICD-10-CM | POA: Diagnosis not present

## 2023-11-27 MED ORDER — FLUOXETINE HCL 20 MG PO CAPS: 20.0000 mg | ORAL_CAPSULE | Freq: Every day | ORAL | 3 refills | Status: DC

## 2023-11-27 NOTE — Progress Notes (Signed)
 I,Jameka J Llittleton, CMA,acting as a Neurosurgeon for [employer], NP.,have documented all relevant documentation on the behalf of Melodie Spry, NP,as directed by  Melodie Spry, NP while in the presence of Melodie Spry, NP.  Subjective:   Patient ID: Javier Leonard , male    DOB: 02/23/1986 , 38 y.o.   MRN: [id number]  Chief Complaint  Patient presents with   Establish Care   Annual Exam    HPI  Patient is 38 year old male who is presents today to establish care. Patient would like to also have an annual  physical examination done.  Patient also wants to be evaluated for anxiety and depression. His PHQ 9 score = 22 and GAD 7 score = 22 also. Will start him on an SSRI and refer him to psychology, will also check some labs.     Past Medical History:  Diagnosis Date   Allergy      Family History  Problem Relation Age of Onset   Diabetes Paternal Uncle    Diabetes Paternal Grandmother      Current Outpatient Medications:    FLUoxetine  (PROZAC ) 20 MG capsule, Take 1 capsule (20 mg total) by mouth at bedtime., Disp: 30 capsule, Rfl: 3   Allergies  Allergen Reactions   Blueberry Flavor [Flavoring Agent]    Dust Mite Extract       Social History   Substance and Sexual Activity  Alcohol Use No   Social History   Tobacco Use  Smoking Status Former   Passive exposure: Never  Smokeless Tobacco Never  .   Review of Systems  Constitutional: Negative.   HENT: Negative.    Eyes: Negative.   Respiratory: Negative.    Cardiovascular: Negative.   Gastrointestinal: Negative.   Endocrine: Negative.   Genitourinary: Negative.   Musculoskeletal: Negative.   Neurological: Negative.   Hematological: Negative.   Psychiatric/Behavioral: Negative.       Today's Vitals   11/27/23 1028  BP: 108/64  Pulse: (!) 106  Temp: 98.9 F (37.2 C)  TempSrc: Oral  Weight: 159 lb (72.1 kg)  Height: 6' 2 (1.88 m)  PainSc: 0-No pain   Body mass index is 20.41 kg/m.  Wt Readings from Last  3 Encounters:  11/27/23 159 lb (72.1 kg)  04/03/21 165 lb (74.8 kg)  08/11/18 165 lb (74.8 kg)    Objective:  Physical Exam HENT:     Head: Normocephalic.   Cardiovascular:     Rate and Rhythm: Tachycardia present.  Pulmonary:     Effort: Pulmonary effort is normal.     Breath sounds: Normal breath sounds.  Abdominal:     General: Bowel sounds are normal.   Skin:    General: Skin is warm and dry.   Neurological:     General: No focal deficit present.     Mental Status: He is alert and oriented to person, place, and time.   Psychiatric:        Mood and Affect: Mood normal.        Behavior: Behavior normal.         Assessment And Plan:    Encounter to establish care with new doctor  Annual physical exam -     CBC -     CMP14+EGFR  Encounter for screening for HIV -     HIV Antibody (routine testing w rflx)  Encounter for hepatitis C screening test for low risk patient -     Hepatitis C antibody  Need for  Tdap vaccination -     Tdap vaccine greater than or equal to 7yo IM  Screening for STD (sexually transmitted disease) -     Chlamydia/Gonococcus/Trichomonas, NAA -     RPR  GAD (generalized anxiety disorder) -     CBC -     CMP14+EGFR -     TSH -     FLUoxetine  HCl; Take 1 capsule (20 mg total) by mouth at bedtime.  Dispense: 30 capsule; Refill: 3  Severe episode of recurrent major depressive disorder, with psychotic features (HCC) -     Ambulatory referral to Psychology  Encounter for screening for cardiovascular disorders -     Lipid panel     Return in 6 weeks (on 01/08/2024) for 1 year physical. Patient was given opportunity to ask questions. Patient verbalized understanding of the plan and was able to repeat key elements of the plan. All questions were answered to their satisfaction.   I, Melodie Spry, NP, have reviewed all documentation for this visit. The documentation on 12/07/2023 for the exam, diagnosis, procedures, and orders are all  accurate and complete.

## 2023-11-27 NOTE — Patient Instructions (Signed)
 Health Maintenance, Male  Adopting a healthy lifestyle and getting preventive care are important in promoting health and wellness. Ask your health care provider about:  The right schedule for you to have regular tests and exams.  Things you can do on your own to prevent diseases and keep yourself healthy.  What should I know about diet, weight, and exercise?  Eat a healthy diet    Eat a diet that includes plenty of vegetables, fruits, low-fat dairy products, and lean protein.  Do not eat a lot of foods that are high in solid fats, added sugars, or sodium.  Maintain a healthy weight  Body mass index (BMI) is a measurement that can be used to identify possible weight problems. It estimates body fat based on height and weight. Your health care provider can help determine your BMI and help you achieve or maintain a healthy weight.  Get regular exercise  Get regular exercise. This is one of the most important things you can do for your health. Most adults should:  Exercise for at least 150 minutes each week. The exercise should increase your heart rate and make you sweat (moderate-intensity exercise).  Do strengthening exercises at least twice a week. This is in addition to the moderate-intensity exercise.  Spend less time sitting. Even light physical activity can be beneficial.  Watch cholesterol and blood lipids  Have your blood tested for lipids and cholesterol at 36 years of age, then have this test every 5 years.  You may need to have your cholesterol levels checked more often if:  Your lipid or cholesterol levels are high.  You are older than 36 years of age.  You are at high risk for heart disease.  What should I know about cancer screening?  Many types of cancers can be detected early and may often be prevented. Depending on your health history and family history, you may need to have cancer screening at various ages. This may include screening for:  Colorectal cancer.  Prostate cancer.  Skin cancer.  Lung  cancer.  What should I know about heart disease, diabetes, and high blood pressure?  Blood pressure and heart disease  High blood pressure causes heart disease and increases the risk of stroke. This is more likely to develop in people who have high blood pressure readings or are overweight.  Talk with your health care provider about your target blood pressure readings.  Have your blood pressure checked:  Every 3-5 years if you are 36-36 years of age.  Every year if you are 36 years old or older.  If you are between the ages of 36 and 36 and are a current or former smoker, ask your health care provider if you should have a one-time screening for abdominal aortic aneurysm (AAA).  Diabetes  Have regular diabetes screenings. This checks your fasting blood sugar level. Have the screening done:  Once every three years after age 36 if you are at a normal weight and have a low risk for diabetes.  More often and at a younger age if you are overweight or have a high risk for diabetes.  What should I know about preventing infection?  Hepatitis B  If you have a higher risk for hepatitis B, you should be screened for this virus. Talk with your health care provider to find out if you are at risk for hepatitis B infection.  Hepatitis C  Blood testing is recommended for:  Everyone born from 36 through 1963.  Anyone  with known risk factors for hepatitis C.  Sexually transmitted infections (STIs)  You should be screened each year for STIs, including gonorrhea and chlamydia, if:  You are sexually active and are younger than 36 years of age.  You are older than 36 years of age and your health care provider tells you that you are at risk for this type of infection.  Your sexual activity has changed since you were last screened, and you are at increased risk for chlamydia or gonorrhea. Ask your health care provider if you are at risk.  Ask your health care provider about whether you are at high risk for HIV. Your health care provider  may recommend a prescription medicine to help prevent HIV infection. If you choose to take medicine to prevent HIV, you should first get tested for HIV. You should then be tested every 3 months for as long as you are taking the medicine.  Follow these instructions at home:  Alcohol use  Do not drink alcohol if your health care provider tells you not to drink.  If you drink alcohol:  Limit how much you have to 0-2 drinks a day.  Know how much alcohol is in your drink. In the [place] one drink equals one 12 oz bottle of beer (355 mL), one 5 oz glass of wine (148 mL), or one 1 oz glass of hard liquor (44 mL).  Lifestyle  Do not use any products that contain nicotine or tobacco. These products include cigarettes, chewing tobacco, and vaping devices, such as e-cigarettes. If you need help quitting, ask your health care provider.  Do not use street drugs.  Do not share needles.  Ask your health care provider for help if you need support or information about quitting drugs.  General instructions  Schedule regular health, dental, and eye exams.  Stay current with your vaccines.  Tell your health care provider if:  You often feel depressed.  You have ever been abused or do not feel safe at home.  Summary  Adopting a healthy lifestyle and getting preventive care are important in promoting health and wellness.  Follow your health care provider's instructions about healthy diet, exercising, and getting tested or screened for diseases.  Follow your health care provider's instructions on monitoring your cholesterol and blood pressure.  This information is not intended to replace advice given to you by your health care provider. Make sure you discuss any questions you have with your health care provider.  Document Revised: 10/29/2020 Document Reviewed: 10/29/2020  Elsevier Patient Education  2024 [employer].

## 2023-11-28 LAB — CBC
Hematocrit: 48 % (ref 37.5–51.0)
Hemoglobin: 15.8 g/dL (ref 13.0–17.7)
MCH: 30.8 pg (ref 26.6–33.0)
MCHC: 32.9 g/dL (ref 31.5–35.7)
MCV: 94 fL (ref 79–97)
Platelets: 352 10*3/uL (ref 150–450)
RBC: 5.13 x10E6/uL (ref 4.14–5.80)
RDW: 12.8 % (ref 11.6–15.4)
WBC: 10.1 10*3/uL (ref 3.4–10.8)

## 2023-11-28 LAB — CMP14+EGFR
ALT: 12 IU/L (ref 0–44)
AST: 27 IU/L (ref 0–40)
Albumin: 4.1 g/dL (ref 4.1–5.1)
Alkaline Phosphatase: 54 IU/L (ref 44–121)
BUN/Creatinine Ratio: 9 (ref 9–20)
BUN: 7 mg/dL (ref 6–20)
Bilirubin Total: 0.6 mg/dL (ref 0.0–1.2)
CO2: 25 mmol/L (ref 20–29)
Calcium: 9.7 mg/dL (ref 8.7–10.2)
Chloride: 100 mmol/L (ref 96–106)
Creatinine, Ser: 0.8 mg/dL (ref 0.76–1.27)
Globulin, Total: 2.2 g/dL (ref 1.5–4.5)
Glucose: 86 mg/dL (ref 70–99)
Potassium: 5.2 mmol/L (ref 3.5–5.2)
Sodium: 139 mmol/L (ref 134–144)
Total Protein: 6.3 g/dL (ref 6.0–8.5)
eGFR: 118 mL/min/{1.73_m2} (ref >59)

## 2023-11-28 LAB — RPR: RPR Ser Ql: NONREACTIVE

## 2023-11-28 LAB — LIPID PANEL
Chol/HDL Ratio: 4 ratio (ref 0.0–5.0)
Cholesterol, Total: 166 mg/dL (ref 100–199)
HDL: 42 mg/dL (ref >39)
LDL Chol Calc (NIH): 112 mg/dL — ABNORMAL HIGH (ref 0–99)
Triglycerides: 62 mg/dL (ref 0–149)
VLDL Cholesterol Cal: 12 mg/dL (ref 5–40)

## 2023-11-28 LAB — HIV ANTIBODY (ROUTINE TESTING W REFLEX): HIV Screen 4th Generation wRfx: NONREACTIVE

## 2023-11-28 LAB — TSH: TSH: 1.5 u[IU]/mL (ref 0.450–4.500)

## 2023-11-28 LAB — HEPATITIS C ANTIBODY: Hep C Virus Ab: NONREACTIVE

## 2023-11-30 LAB — CHLAMYDIA/GONOCOCCUS/TRICHOMONAS, NAA
Chlamydia by NAA: NEGATIVE
Gonococcus by NAA: NEGATIVE
Trich vag by NAA: NEGATIVE

## 2023-12-07 ENCOUNTER — Ambulatory Visit: Payer: Self-pay | Admitting: Family Medicine

## 2023-12-07 DIAGNOSIS — Z1159 Encounter for screening for other viral diseases: Secondary | ICD-10-CM | POA: Insufficient documentation

## 2023-12-07 DIAGNOSIS — Z0000 Encounter for general adult medical examination without abnormal findings: Secondary | ICD-10-CM | POA: Insufficient documentation

## 2023-12-07 DIAGNOSIS — F333 Major depressive disorder, recurrent, severe with psychotic symptoms: Secondary | ICD-10-CM | POA: Insufficient documentation

## 2023-12-07 DIAGNOSIS — Z7689 Persons encountering health services in other specified circumstances: Secondary | ICD-10-CM | POA: Insufficient documentation

## 2023-12-07 DIAGNOSIS — Z23 Encounter for immunization: Secondary | ICD-10-CM | POA: Insufficient documentation

## 2023-12-07 DIAGNOSIS — F411 Generalized anxiety disorder: Secondary | ICD-10-CM | POA: Insufficient documentation

## 2023-12-07 DIAGNOSIS — Z136 Encounter for screening for cardiovascular disorders: Secondary | ICD-10-CM | POA: Insufficient documentation

## 2023-12-07 DIAGNOSIS — Z113 Encounter for screening for infections with a predominantly sexual mode of transmission: Secondary | ICD-10-CM | POA: Insufficient documentation

## 2023-12-07 DIAGNOSIS — Z114 Encounter for screening for human immunodeficiency virus [HIV]: Secondary | ICD-10-CM | POA: Insufficient documentation

## 2023-12-07 NOTE — Progress Notes (Signed)
 LDL is slightly elevated at 112. Low fats diet and exercise advised. All other labs including the STD tests came back normal.  Thank you!

## 2024-01-12 ENCOUNTER — Encounter: Admitting: Family Medicine

## 2024-01-12 NOTE — Progress Notes (Signed)
 I,Javier Leonard, CMA,acting as a Neurosurgeon for [employer], NP.,have documented all relevant documentation on the behalf of Javier Creighton, NP,as directed by  Javier Creighton, NP while in the presence of Javier Creighton, NP.  Subjective:  Patient ID: Javier Leonard , male    DOB: 1986/06/02 , 38 y.o.   MRN: [id number]  Chief Complaint  Patient presents with   Annual Exam    Patient presents today for a physical. Patient doesn't have any questions or concerns at this time.     HPI  HPI   Past Medical History:  Diagnosis Date   Allergy      Family History  Problem Relation Age of Onset   Diabetes Paternal Uncle    Diabetes Paternal Grandmother      Current Outpatient Medications:    FLUoxetine  (PROZAC ) 20 MG capsule, Take 1 capsule (20 mg total) by mouth at bedtime., Disp: 30 capsule, Rfl: 3   Allergies  Allergen Reactions   Blueberry Flavor [Flavoring Agent]    Dust Mite Extract      Review of Systems   Today's Vitals   01/12/24 1454  TempSrc: Oral   There is no height or weight on file to calculate BMI.  Wt Readings from Last 3 Encounters:  11/27/23 159 lb (72.1 kg)  04/03/21 165 lb (74.8 kg)  08/11/18 165 lb (74.8 kg)    The ASCVD Risk score (Arnett DK, et al., 2019) failed to calculate for the following reasons:   The 2019 ASCVD risk score is only valid for ages 36 to 47  Objective:  Physical Exam      Assessment And Plan:  Encounter for general adult medical examination w/o abnormal findings    Return for 1 year physical.  Patient was given opportunity to ask questions. Patient verbalized understanding of the plan and was able to repeat key elements of the plan. All questions were answered to their satisfaction.    I, Javier Creighton, NP, have reviewed all documentation for this visit. The documentation on 01/12/24 for the exam, diagnosis, procedures, and orders are all accurate and complete.   IF YOU HAVE BEEN REFERRED TO A SPECIALIST, IT MAY TAKE 1-2 WEEKS TO  SCHEDULE/PROCESS THE REFERRAL. IF YOU HAVE NOT HEARD FROM US /SPECIALIST IN TWO WEEKS, PLEASE GIVE US  A CALL AT [phone].

## 2024-01-15 ENCOUNTER — Encounter: Admitting: Family Medicine

## 2024-01-20 ENCOUNTER — Encounter: Payer: Self-pay | Admitting: Family Medicine

## 2024-01-20 ENCOUNTER — Ambulatory Visit (INDEPENDENT_AMBULATORY_CARE_PROVIDER_SITE_OTHER): Admitting: Family Medicine

## 2024-01-20 VITALS — BP 110/60 | HR 67 | Temp 98.6°F | Ht 74.0 in | Wt 161.8 lb

## 2024-01-20 DIAGNOSIS — E7800 Pure hypercholesterolemia, unspecified: Secondary | ICD-10-CM

## 2024-01-20 DIAGNOSIS — F333 Major depressive disorder, recurrent, severe with psychotic symptoms: Secondary | ICD-10-CM | POA: Diagnosis not present

## 2024-01-20 DIAGNOSIS — F411 Generalized anxiety disorder: Secondary | ICD-10-CM

## 2024-01-20 NOTE — Patient Instructions (Signed)

## 2024-01-20 NOTE — Assessment & Plan Note (Signed)
 LDL cholesterol slightly elevated.  Dietary modifications recommended. - Advise low-fat diet, avoiding fatty and fried foods, opting for boiled or grilled options.

## 2024-01-20 NOTE — Progress Notes (Addendum)
 I,[name], CMA,acting as a Neurosurgeon for [employer], NP.,have documented all relevant documentation on the behalf of Javier Creighton, NP,as directed by  Javier Creighton, NP while in the presence of Javier Creighton, NP.  Subjective:  Patient ID: Javier Leonard , male    DOB: 1985-09-09 , 38 y.o.   MRN: [id number]  Chief Complaint  Patient presents with   Anxiety    Patient presents today for follow up for anxiety. He has not started the Prozac .     HPI Discussed the use of AI scribe software for clinical note transcription with the patient, who gave verbal consent to proceed.  History of Present Illness     Javier Leonard is a 38 year old male with anxiety and depression who presents for a follow-up visit.  He was prescribed Prozac  to be taken once daily for his anxiety and depression about 6 weeks ago but has not started the medication due to forgetting to pick it up from the pharmacy after being informed of a one-day wait. Advised him to go pick it up and start taking them today and will refer him to behavioral integrated .    Patient/Guardian was advised Release of Information must be obtained prior to any record release in order to collaborate their care with an outside provider. Patient/Guardian was advised if they have not already done so to contact the registration department to sign all necessary forms in order for us  to release information regarding their care.   Consent: Patient/Guardian gives verbal consent for treatment and assignment of benefits for services provided during this visit. Patient/Guardian expressed understanding and agreed to proceed.       Past Medical History:  Diagnosis Date   Allergy      Family History  Problem Relation Age of Onset   Diabetes Paternal Uncle    Diabetes Paternal Grandmother      Current Outpatient Medications:    FLUoxetine  (PROZAC ) 20 MG capsule, Take 1 capsule (20 mg total) by mouth at bedtime., Disp: 30 capsule, Rfl: 3    Allergies  Allergen Reactions   Blueberry Flavor [Flavoring Agent]    Dust Mite Extract      Review of Systems  Constitutional: Negative.   Respiratory: Negative.    Gastrointestinal: Negative.   Endocrine: Negative.   Skin: Negative.   Allergic/Immunologic: Negative.   Hematological: Negative.   Psychiatric/Behavioral:  The patient is nervous/anxious.      Today's Vitals   01/20/24 1222  BP: 110/60  Pulse: 67  Temp: 98.6 F (37 C)  TempSrc: Oral  Weight: 161 lb 12.8 oz (73.4 kg)  Height: 6' 2 (1.88 m)   Body mass index is 20.77 kg/m.  Wt Readings from Last 3 Encounters:  01/20/24 161 lb 12.8 oz (73.4 kg)  11/27/23 159 lb (72.1 kg)  04/03/21 165 lb (74.8 kg)     Objective:  Physical Exam HENT:     Head: Normocephalic.  Cardiovascular:     Rate and Rhythm: Normal rate and regular rhythm.  Pulmonary:     Effort: Pulmonary effort is normal.     Breath sounds: Normal breath sounds.  Abdominal:     General: Bowel sounds are normal.  Skin:    General: Skin is warm and dry.  Neurological:     General: No focal deficit present.     Mental Status: He is alert and oriented to person, place, and time.  Psychiatric:        Mood and Affect:  Mood normal.        Behavior: Behavior normal.         Assessment And Plan:  GAD (generalized anxiety disorder) -     Amb ref to Integrated Behavioral Health  Severe episode of recurrent major depressive disorder, with psychotic features (HCC) -     Amb ref to Integrated Behavioral Health  Pure hypercholesterolemia Assessment & Plan: LDL cholesterol slightly elevated.  Dietary modifications recommended. - Advise low-fat diet, avoiding fatty and fried foods, opting for boiled or grilled options.     Assessment & Plan Anxiety and depressive disorders Prozac  prescribed but not initiated. Recommended mental health group and counseling for support. Psychiatrist available for further evaluation. - Instruct to pick up  and start Prozac  as prescribed. - Refer to mental health counseling at the facility. - Schedule follow-up in two months to evaluate medication and counseling progress. - Advise use of MyChart for communication and scheduling.      Return in about 2 months (around 03/22/2024) for anxiety.  Patient was given opportunity to ask questions. Patient verbalized understanding of the plan and was able to repeat key elements of the plan. All questions were answered to their satisfaction  I, Javier Creighton, NP, have reviewed all documentation for this visit. The documentation on 01/25/2024 for the exam, diagnosis, procedures, and orders are all accurate and complete.    IF YOU HAVE BEEN REFERRED TO A SPECIALIST, IT MAY TAKE 1-2 WEEKS TO SCHEDULE/PROCESS THE REFERRAL. IF YOU HAVE NOT HEARD FROM US /SPECIALIST IN TWO WEEKS, PLEASE GIVE US  A CALL AT [phone].   THE PATIENT IS ENCOURAGED TO PRACTICE SOCIAL DISTANCING DUE TO THE COVID-19 PANDEMIC.

## 2024-02-16 ENCOUNTER — Telehealth: Payer: Self-pay | Admitting: Licensed Clinical Social Worker

## 2024-02-16 ENCOUNTER — Institutional Professional Consult (permissible substitution): Admitting: Licensed Clinical Social Worker

## 2024-02-16 ENCOUNTER — Ambulatory Visit (INDEPENDENT_AMBULATORY_CARE_PROVIDER_SITE_OTHER): Admitting: Licensed Clinical Social Worker

## 2024-02-16 DIAGNOSIS — F4323 Adjustment disorder with mixed anxiety and depressed mood: Secondary | ICD-10-CM | POA: Diagnosis not present
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
## 2024-02-16 NOTE — Telephone Encounter (Signed)
 [facility] Collaborative Care Clinician attempted to connect with patient for scheduled appointment in office. Patient did not show up for the appointment. After waiting 15 minutes, clinician attempted to reach pt by phone to offer virtual visit. Pt did not answer phone and clinician left HIPAA compliant voicemail for pt to call office to reschedule appointment.   Per [facility] policy visit will be coded as no show

## 2024-02-16 NOTE — Patient Instructions (Addendum)
 Advocacy/Legal Legal Aid [place]:  [phone]  /  [phone] /  LVM, taking clients  Family Justice Center:  [phone] /  Onsite, counseling with Bro[place]ick is virtual, Accepting new clients   Family Service of the [employer] 24-hr Crisis line:  [phone] Virtual & Onsite services (Client preference), Accepting New clients  [employer], [place]:  [phone] Virtual & Onsite services (Client preference), Accepting New clients  Court Watch (custody):  [phone] Virtual, Accepting new clients  [employer] [facility]:   [phone] Virtual/Telephone, accepting clients for waitlisting (time depends on services)   Baby & Breastfeeding [place] [facility] [phone] Outpatient consultant out for weeks (will be hard to get an appointment) , Support group offered Virtually (Accepting new members)  [facility] Lactation [phone] Telephone & Onsite services (Client preference), Accepting New clients  WIC: [phone] ([place]);  [phone] ([place]) Virtual  [place] League:  [phone]     Childcare [place] [facility]: [phone] ([place]) / [phone] ([place])             - Child Care Resources/ Referrals/ Scholarships             - Head Start/ Early Head Start (call or apply online) Virtually (by appointment), Accepting new families  [place] DHHS: [place] Pre-K :  [phone] / [phone]     Employment / Job Search [employer] of [place]: [phone] / [address] Human resources officer (Client preference), Accepting New clients  [place] [employer] (JobLink): [phone] ([place]) / [phone] ([place]) Virtual & Onsite workshops, Accepting new clients  [employer] laboratory technician Resource/ Career Center: [phone] / [phone] Virtual & Onsite , Accepting New clients  [facility] Job & Career Center: [phone]   DHHS Work First: [phone] ([place]) / ([phone] ([place]) Virtually, Accepting clients     Financial  Assistance [place] [facility]:  360-212-9522 Financial assistance & other services  Salvation Army: 704-265-0680   Javier Network (furniture):  (463)168-9435   [facility] Helping Hands: (234) 620-5483   Low Income Energy Assistance: (567)238-9600     Food Assistance DHHS- SNAP/ Food Stamps: (502)324-6307   WIC: LORRY669-142-9242 ;  HP 971-529-8779     General Health / Clinics (Adults) Orange Card (for Adults) through [facility]: [phone]    [facility]:   [phone]   [facility] Wellness:   [phone]   Health Department:  [phone]   [facility] [place]:   (604)304-0442 Onsite  [facility]:   [phone] Onsite     [place]ousing [place] Housing Coalition:   972-228-9690   [facility] Housing Authority:  (442)497-0701   Affordable Housing Managemnt:  (803)371-6022     Immigrant/ Refugee Center for [facility]):  [phone] Onsite, Accepting new people  Fa[employer] House:  3194751566 Virtual, accepting new individuals  [employer] [facility]:  804-858-1101 Onsite & Virtual, Accepting new individuals  [facility] Services:  [phone] Virtual, Accepting new clients  African Services Coalition:  (956)047-9634     LGBTQ Youth SAFE  www.youthsafe[place].org  Virtual, Accepting new members  PFLAG  662-606-4431 / info@[place] .org  Virtual, Accepting new Members  [facility]:  [phone]  Virtual    Mental Health/ Substance Use Family Service of the [facility]  [phone]  Shelvy Ruthellen CHILD [address] Virtual & Onsite services (Client preference), Accepting New clients  [facility]:  [phone] or [phone] [address] [place], [place] [address] Onsite & Virtual, Accepting new  clients  [facility]:  [phone] [address] [place], [place] [address] Virtual & Onsite, Accepting new clients   [facility]:  [phone] Claris [place], [place]) [address] [place] [address] [place], [place] [address] Onsite & Virtual, Accepting new clients  [place] (walk-ins)  [phone] / [address]   [place]:  ([phone] Virtual meetings via Zoom- need meeting passcode- call [phone] to receive code AFG= Al-Anon Family Group  [email or web address]   Alcoholics Anonymous:  814 605 5920 TonerProviders.com.cy  Narcotics Anonymous:  903-725-3049 24 hour helpline: 250-194-9351  Quit Smoking Hotline:  800-QUIT-NOW 234-505-5407)     COUNSELING AGENCIES in [place] (Accepting Medicaid)   Mental Health  (* = Spanish available;  + = Psychiatric services) * Family Service of the [facility]                                [phone] [address] [place], [place] [address] Virtual & Onsite services (Client preference), Accepting New clients  *+ [facility]:                                        458-735-1813 or [phone] Virtual & Onsite, Accepting clients  +[facility] Total Access Care                                [phone]   [facility]:                                                 [phone]   + [facility]:                                           [phone] Onsite & Virtual, Accepting new clients  [facility]                               [phone] Onsite, Accepting new clients  * Family Solutions:                                                     [phone] Virtual, NOT accepting new clients  The Social Emotional Learning (SEL) Group           (505) [phone] Virtual, accepting new clients  [facility]:                                        [phone] Onsite & Virtual, Waitlist 6-8 months for services  [facility]:                             [phone]   *[facility]                                                [phone]  [phone] [phone] Onsite & Virtual, Accepting new clients   + [facility]:             (909)[phone] or ([phone]   [facility]                                              [phone] Onsite & Virtual, Accepting new clients  Youth Unlimited (PCIT)                                              (718)401-1754 Onsite & Virtual, At Capacity (check in occasionally , subject to change)    Substance Use [place]:                                (303) [phone]  Alcoholics Anonymous:      (646) 700-2403  Narcotics Anonymous:       417-038-1566  Quit Smoking Hotline:         800-QUIT-NOW 502-759-9728)     Parenting Children's Home Society:  7121893122 Virtual , Accepting families  YWCA: 508-318-5327   UNCG: Bringing Out the Best:  ([phone]              Thriving at Three (Hispanic families): [phone] Onsite, Accepting new children ( short wait list)  Healthy Start (Family Service of the [place]):  ([phone]   Parents as Teachers:  3522436442 Virtually, accepting families ( waitlist for Spanish speaking families )  [place] Child Counsellor- Learning Together (Immigrants): 714-345-0979     Poison Control [phone]   Sports & Recreation [employer] Open Doors Application: https://www.rich.com/ Onsite, Accepting new families  [place] Recreation Centers: http://www.[place].gov/index.aspx?page=3615 Onsite    Special Needs Family Support Network:  250-394-0712 Virtual, Accepting new families  Autism Society of [place]:   872-587-8399 323-585-6716 or 7045184995 /  [phone] Virtual, Accepting new families   [facility]:  [phone] Virtual, Accepting families  ARC of [place]:  747-069-4224 Virtual, Accepting new families  Children's Developmental Service Agency (CDSA):  604-660-9097 Virtual, Accepting new families    Transportation Medicaid Transportation: 754-803-5404 to apply  [place]: 220-164-3208 (reduced-fare bus ID to Medicaid/ Medicare/  [place] Card)  SCAT Paratransit services: Eligible riders only, call (860) [phone] for application    Tutoring/ Mentoring [employer]: (514)317-4106 No tutoring only afterschool programming (In Person)  Big Brothers/ Big Sisters: 573-041-8858 [place])  8483853170 ([place])   ACES through child's school: (480) [phone]   [employer] Achievers: contact your local Y In Person, Accepting New students   Updated 11/2022

## 2024-02-17 ENCOUNTER — Telehealth: Payer: Self-pay | Admitting: Licensed Clinical Social Worker

## 2024-02-17 DIAGNOSIS — F4323 Adjustment disorder with mixed anxiety and depressed mood: Secondary | ICD-10-CM
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
## 2024-02-23 ENCOUNTER — Ambulatory Visit (INDEPENDENT_AMBULATORY_CARE_PROVIDER_SITE_OTHER): Payer: MEDICAID | Admitting: Licensed Clinical Social Worker

## 2024-02-23 DIAGNOSIS — F4323 Adjustment disorder with mixed anxiety and depressed mood: Secondary | ICD-10-CM

## 2024-02-23 NOTE — Patient Instructions (Signed)
 Advocacy/Legal Legal Aid [place]:  [phone]  /  [phone] /  LVM, taking clients  Family Justice Center:  [phone] /  Onsite, counseling with Javier Leonard is virtual, Accepting new clients   Family Service of the [employer] 24-hr Crisis line:  [phone] Virtual & Onsite services (Client preference), Accepting New clients  [employer], [place]:  [phone] Virtual & Onsite services (Client preference), Accepting New clients  Court [employer] (custody):  [phone] Virtual, Accepting new clients  [employer] [facility]:   [phone] Virtual/Telephone, accepting clients for waitlisting (time depends on services)   Baby [facility] [phone] Outpatient consultant out for weeks (will be hard to get an appointment) , Support group offered Virtually (Accepting new members)  [facility] Lactation [phone] Telephone & Onsite services (Client preference), Accepting New clients  WIC: [phone] ([place]);  [phone] ([place]) Virtual  [place] [facility]:  [phone]     Childcare [place] [facility]: [phone] ([place]) / [phone] ([place])             - Child Care Resources/ Referrals/ Scholarships             - Head Start/ Early Head Start (call or apply online) Virtually (by appointment), Accepting new families  [place] DHHS: [place] Pre-K :  [phone] / [phone]     Employment / Job Search [employer] of [place]: [phone] / [address] Human resources officer (Client preference), Accepting New clients  [place] [employer] (JobLink): [phone] ([place]) / ([phone] ([place]) Virtual & Onsite workshops, Accepting new clients  [employer] laboratory technician Resource/ Career Center: [phone] / [phone] Virtual & Onsite , Accepting New clients  [facility] Job & Career Center: [phone]   DHHS Work First: [phone] ([place]) / ([phone] ([place]) Virtually, Accepting clients     Financial  Assistance [place] [employer]:  360-212-9522 Financial assistance & other services  [employer]: 704-265-0680   Javier Leonard (furniture):  ([phone]   [facility] Helping Hands: (234) [phone]   Low Income Energy Assistance: (567)238-9600     Food Assistance DHHS- SNAP/ Food Stamps: (502)324-6307   WIC: LORRY669-142-9242 ;  HP 971-529-8779     General Health / Clinics (Adults) Orange Card (for Adults) through [facility]: [phone]    [facility]:   [phone]   [facility]:   [phone]   Health Department:  [phone]   [facility] [place]:   (604)304-0442 Onsite  [facility]:   [phone] x [phone] Onsite     [place] Housing Coalition:   972-228-9690   [facility] Housing Authority:  (442)497-0701   Affordable Housing Managemnt:  (803)371-6022     Immigrant/ Refugee Center for [facility]):  [phone] Onsite, Accepting new people  Fa[employer] International House:  3194751566 Virtual, accepting new individuals  [employer]:  804-858-1101 Onsite & Virtual, Accepting new individuals  [facility] Services:  415-451-9801 Virtual, Accepting new clients  African Services Coalition:  (956)047-9634     LGBTQ Youth SAFE  www.youthsafe[place].org  Virtual, Accepting new members  PFLAG  662-606-4431 / info@[place] .org  Virtual, Accepting new Members  [facility]:  442-287-9784  Virtual    Mental Health/ Substance Use Family Service of the [facility]  [phone]  Javier Leonard CHILD [address] Virtual & Onsite services (Client preference), Accepting New clients  [facility]:  [phone] or [phone] [address] [place], [place] [address] Onsite & Virtual, Accepting new  clients  [facility]:  [phone] [address] [place], [place] [address] Virtual & Onsite, Accepting new clients   [facility]:  [phone] Claris [place], [place]) [address] [place] [address] [place], [place] [address] Onsite & Virtual, Accepting new clients  [place] (walk-ins)  [phone] / [address]   [place]:  [phone] Virtual meetings via Zoom- need meeting passcode- call [phone] to receive code AFG= Al-Anon Family Group  [email or web address]   Alcoholics Anonymous:  [phone] TonerProviders.com.cy  Narcotics Anonymous:  903-725-3049 24 hour helpline: 250-194-9351  Quit Smoking Hotline:  800-QUIT-NOW 234-505-5407)     COUNSELING AGENCIES in [place] (Accepting Medicaid)   Mental Health  (* = Spanish available;  + = Psychiatric services) * Family Service of the [facility]                                [phone] [address] [place], [place] [address] Virtual & Onsite services (Client preference), Accepting New clients  *+ [facility]:                                        [phone] or [phone] Virtual & Onsite, Accepting clients  [facility] Total Access Care                                [phone]   [facility]:                                                 [phone]   + [facility]:                                           [phone] Onsite & Virtual, Accepting new clients  [facility]                               [phone] Onsite, Accepting new clients  * [facility]:                                                     [phone] Virtual, NOT accepting new clients  The Social Emotional Learning (SEL) Group           ([phone] Virtual, accepting new clients  [facility]:                                        [phone] Onsite & Virtual, Waitlist 6-8 months for services  [facility]:                             [phone]   *[facility]                                                (  [phone] Onsite & Virtual, Accepting new clients   + [facility]:             ([phone] or [phone]   [facility]                                              [phone] Onsite & Virtual, Accepting new clients  Youth Unlimited (PCIT)                                              (718)401-1754 Onsite & Virtual, At Capacity (check in occasionally , subject to change)    Substance Use [place]:                                (303) [phone]  Alcoholics Anonymous:      (646) 700-2403  Narcotics Anonymous:       417-038-1566  Quit Smoking Hotline:         800-QUIT-NOW 502-759-9728)     Parenting Children's Home Society:  7121893122 Virtual , Accepting families  YWCA: 508-318-5327   UNCG: Bringing Out the Best:  ([phone]              Thriving at Three (Hispanic families): [phone] Onsite, Accepting new children ( short wait list)  Healthy Start (Family Service of the [place]):  ([phone] [phone]   Parents as Teachers:  [phone] Virtually, accepting families ( waitlist for Spanish speaking families )  [place] Child Counsellor- Learning Together (Immigrants): 714-345-0979     Poison Control [phone]   Sports & Recreation [employer] Open Doors Application: https://www.rich.com/ Onsite, Accepting new families  [place] Recreation Centers: http://www.[place].gov/index.aspx?page=3615 Onsite    Special Needs Family Support Leonard:  250-394-0712 Virtual, Accepting new families  Autism Society of [place]:   [phone] or 7045184995 /  [phone] Virtual, Accepting new families   [facility]:  [phone] Virtual, Accepting families  ARC of [place]:  [phone] Virtual, Accepting new families  Children's Developmental Service Agency (CDSA):  604-660-9097 Virtual, Accepting new families    Transportation Medicaid Transportation: 754-803-5404 to apply  [place] Authority: 220-164-3208 (reduced-fare bus ID to Medicaid/ Medicare/  [place] Card)  SCAT Paratransit services: Eligible riders only, call ([phone] for application    Tutoring/ Mentoring [employer] Child [employer] Institute: (514)317-4106 No tutoring only afterschool programming (In Person)  Big Brothers/ Big Sisters: 573-041-8858 [place])  8483853170 ([place])   ACES through child's school: ([phone]   [employer] Achievers: contact your local Y In Person, Accepting New students   Updated 11/2022

## 2024-02-23 NOTE — BH Specialist Note (Addendum)
 [place] Virtual Audio BH Follow Up Assessment    Patient/Family location: Home, [facility] Provider location: Virtual office, [place] All persons participating in visit: Javier Leonard, Javier SAUNDERS Gavan Nordby, LCSW   I connected with patient and/or family via Audio (caregility) and verified that I am speaking with the correct person using two identifiers. Discussed confidentiality: Yes   I discussed the limitations of telemedicine and the availability of in person appointments.  Discussed there is a possibility of technology failure and discussed alternative modes of communication if that failure occurs.  I discussed that engaging in this telemedicine visit, they consent to the provision of behavioral healthcare and the services will be billed under their insurance.  Patient and/or legal guardian expressed understanding and consented to Telemedicine visit: Yes    Integrated Behavioral Health virtual follow up  Visit  MRN: [id number] Name: Javier Leonard  Number of Integrated Behavioral Health Clinician visits: 3- Third Visit  Session Start time: 1400   Session End time: 1415  Total time in minutes: 15    Types of Service: Collaborative care  Interpretor:No. Interpretor Name and Language: n/a  Subjective: Javier Leonard is a 38 y.o. male accompanied by n/a Patient was referred by Javier Creighton NP for [facility] collaborative care supports and community resources. Patient reports the following symptoms/concerns: Pt states he is now taking the Prozac  daily. Pt still reporting symptoms of depression as a 10/10 (10 being most severe) and 10/10 for anxiety (10 being most severe). Duration of problem: ongoing; Severity of problem: severe  Objective: Mood: Depressed and Affect: Blunt and Depressed Risk of harm to self or others: No plan to harm self or others  Life Context: Family and Social: Pt reports that he is trying to get along with his friends/family so that he will have a place to stay if  needed--trying to keep peace  School/Work: pt is excited that he is applying for jobs and has upcoming job interview Self-Care: pt states that he's trying the best he can to take care of himself when he's at a state of calm and rest.  Life Changes: pt is excited about the energy that he has re: securing employment  Patient and/or Family's Strengths/Protective Factors: Social connections, Sense of purpose, and Physical Health (exercise, healthy diet, medication compliance, etc.)  Goals Addressed: Patient will:  Reduce symptoms of: anxiety and depression   Increase knowledge and/or ability of: coping skills, self-management skills, and stress reduction   Demonstrate ability to: Increase healthy adjustment to current life circumstances, Increase adequate support systems for patient/family, and Improve medication compliance  Progress towards Goals: Ongoing  Interventions: Interventions utilized:  Motivational Interviewing, Solution-Focused Strategies, Medication Monitoring, and Link to [employer] Assessments completed: Likert scale 1-10 with 10 representing highest level of symptoms. Pt reports current depression: 10/10 and anxiety 10/10  Patient and/or Family Response: Pt reports that he wants to continue seeking employment and being compliant with medication.  Patient Centered Plan: Patient is on the following Treatment Plan(s):   1. Continuing collaborative care team medication management with possible psychiatric referral in future if limited progress noted.   2. BH specialist to follow up 3. Continue Prozac  20 mg daily 4. Hydroxyzine  10 mg TID PRN recommended 5. Recommend vitamin D lab  Clinical Assessment/Diagnosis  Adjustment disorder with mixed anxiety and depressed mood    Assessment: Patient currently experiencing depression and anxiety .   Patient may benefit from ongoing IBH support, additional community resources. Provided pt with additional  list of  community resources via after visit summary.  Plan: Follow up with behavioral health clinician on : 9/9 @11am  virtual  Behavioral recommendations: continue with medication compliance and continue seeking employment Referral(s): Community Resources:  Arts administrator, Actuary, Housing, and [employer] Javier Leonard, [employer]

## 2024-02-29 ENCOUNTER — Ambulatory Visit: Admitting: Podiatry

## 2024-02-29 ENCOUNTER — Encounter: Payer: Self-pay | Admitting: Family Medicine

## 2024-03-01 ENCOUNTER — Telehealth: Payer: Self-pay | Admitting: Licensed Clinical Social Worker

## 2024-03-01 ENCOUNTER — Ambulatory Visit (INDEPENDENT_AMBULATORY_CARE_PROVIDER_SITE_OTHER): Admitting: Licensed Clinical Social Worker

## 2024-03-01 DIAGNOSIS — Z91199 Patient's noncompliance with other medical treatment and regimen due to unspecified reason: Secondary | ICD-10-CM

## 2024-03-01 NOTE — Telephone Encounter (Signed)
 [facility] Collaborative Care Clinician attempted to connect with patient for scheduled virtual/phone appointment. After waiting 15 minutes, clinician attempted to reach pt by phone again. Pt did not answer phone and clinician left HIPAA compliant voicemail for pt to call office to reschedule appointment.   Per [facility] policy visit will be coded as no show

## 2024-03-02 ENCOUNTER — Ambulatory Visit: Admitting: Family Medicine

## 2024-03-06 MED ORDER — HYDROXYZINE HCL 10 MG PO TABS: 10.0000 mg | ORAL_TABLET | Freq: Three times a day (TID) | ORAL | 3 refills | Status: AC | PRN

## 2024-03-06 NOTE — Addendum Note (Signed)
 Addended byBETHA CREIGHTON, Keria Widrig E on: 03/06/2024 07:36 AM   Modules accepted: Orders

## 2024-03-08 ENCOUNTER — Ambulatory Visit: Admitting: Podiatry

## 2024-03-09 ENCOUNTER — Ambulatory Visit: Payer: MEDICAID | Admitting: Clinical

## 2024-03-09 NOTE — Progress Notes (Unsigned)
 [facility] Counselor Initial Adult In-Person - Comprehensive Clinical Assessment  Name: Javier Leonard Date: 03/09/2024 MRN: [id number] DOB: 24-Aug-1985 PCP: Javier Pries, NP  Session Time start: *** End time: *** Total time: ***  Types of Service: {CHL AMB TYPE OF SERVICE:5618563697}  Guardian/Payee:  ***    Paperwork requested: {EDB:78802}  Javier Leonard participated from office with therapist and consented to treatment. We reviewed the limits of confidentiality prior to the start of the evaluation. Javier Leonard expressed understanding and agreed to proceed.   Reason for referral in patient/family's own words:  ***  Client likes to be called ***.  Primary language at home is {CHL AMB BASIC LANGUAGE SPOKEN:[phone]}  SUBJECTIVE: (Client to answer as appropriate) Gender identity: *** Sex assigned at birth: *** Pronouns: {he/she/they:23295}  Standardized Assessments completed: {IBH Screening Tools:21014051:::0}  Risk Assessment: Danger to Self:  {PSY:22692} Self-injurious Behavior: {PSY:22692} Danger to Others: {PSY:22692} Duty to Warn:{PSY:311194} Physical Aggression / Violence:{PSY:21197} Access to Firearms a concern: {PSY:21197} Gang Involvement:{PSY:21197}  Patient / guardian was educated about steps to take if suicide or homicide risk level increases between visits: {Yes/No-Ex:120004} While future psychiatric events cannot be accurately predicted, the patient does not currently require acute inpatient psychiatric care and does not currently meet [facility]  involuntary commitment criteria.  Current Stressors:  {CHL AMB BH STRESSORS:5795258295}  Client and/or Family's Strengths/Protective Factors: {Patient Coping Strengths:[phone]}   Current Health Habits: Sleep:   ***  Physical Activities: ***  Eating/Appetite: ***  Current Medications and therapies:  Psychotropic medications: Current medications: *** Patient taking  medications as prescribed:  *** Side effects reported: ***  Therapies:  {CHL AMB THERAPIES:[phone]}  Psychiatric Review of systems: Insomnia: *** Changes in appetite: *** Decreased need for sleep: {BHH YES OR NO:22294} Hallucinations: {BHH YES OR NO:22294}   Paranoia: {BHH YES OR NO:22294}    Psychiatric History: Past psychiatry diagnosis: *** Patient currently being seen by therapist/psychiatrist:  *** Prior Suicide Attempts: *** Past psychiatry Hospitalization(s): *** Past history of violence: ***  Social History:  Living situation: *** Relationship status: *** Partner Preference: *** Number of Children if applicable: ***  Employment: *** Education: Artist history: *** Legal History/Concerns: *** Religious/spiritual beliefs or involvement: ***  Any cultural differences that may affect / interfere with treatment:  {Religious/Cultural:200019}  Current or History of Alcohol/Substance use: Do you use Caffeine? {BHH YES OR NO:22294} Have you recently consumed alcohol? {YES/NO/WILD RJMID:81418}  Have you recently used any drugs, eg marijuana, other substances or prescriptions drugs not prescribed to them?  {YES/NO/WILD RJMID:81418}  Have you recently consumed any tobacco or nicotine? {YES/NO/WILD RJMID:81418} Does patient seem concerned about dependence or abuse of any substance? {YES/NO/WILD RJMID:81418}  Traumatic Experiences/Abuse history: Have you ever been exposed, witnessed or experienced any form of abuse, what type? {BHH YES OR NO:22294} {Type of abuse:20566} Have you ever been exposed, witnessed or experienced something traumatic, describe? {BHH YES OR NO:22294} ***   Family of Origin (Childhood History) ***  Family history: Family mental illness:  {CHL AMB FAMILY MENTAL [phone]} Family history of bipolar disorder: {BHH YES OR NO:22294} Family school achievement history:  {CHL AMB FAMILY SCHOOL ACHIEVEMENT HISTORY:[phone]} Other relevant  family history:  {CHL AMB OTHER RELEVANT FAMILY HISTORY:210130114}  Family History:  Family History  Problem Relation Age of Onset   Diabetes Paternal Uncle    Diabetes Paternal Grandmother      Client Medical history  Medical History/Surgical History: {Desc; reviewed/not reviewed:60074}' Past Medical History:  Diagnosis Date   Allergy  Past Surgical History:  Procedure Laterality Date   EYE SURGERY      Medications: Current Outpatient Medications  Medication Sig Dispense Refill   FLUoxetine  (PROZAC ) 20 MG capsule Take 1 capsule (20 mg total) by mouth at bedtime. 30 capsule 3   hydrOXYzine  (ATARAX ) 10 MG tablet Take 1 tablet (10 mg total) by mouth 3 (three) times daily as needed for anxiety. 90 tablet 3   No current facility-administered medications for this visit.    Allergies  Allergen Reactions   Blueberry Flavor [Flavoring Agent]    Dust Mite Extract      Interventions: Interventions utilized:  {IBH Interventions:21014054:::0}   Client and/or Family Response:  ***   Mental status exam:   General Appearance /Behavior:  {BHH GENERALAPPEARANCE/BEHAVIOR:22300} Eye Contact:  {BHH EYE CONTACT:22301} Motor Behavior:  {BHH MOTOR BEHAVIOR:22302} Speech:  {BHH SPEECH:22304} Level of Consciousness:  {BHH LEVEL OF CONSCIOUSNESS:22305} Mood:  {BHH MOOD:22306} Affect:  {BHH AFFECT:22307} Anxiety Level:  {BHH ANXIETY LEVEL:22308} Thought Process:  {BHH THOUGHT PROCESS:22309} Thought Content:  {BHH THOUGHT CONTENT:22310} Perception:  {BHH PERCEPTION:22311} Judgment:  {BHH JUDGMENT:22312} Insight:  {BHH INSIGHT:22313}   Clinical Assessment: ***   Diagnosis: No diagnosis found.  Coordination of Care: {CHL AMB BH COORDINATION OF RJMZ:[id number]}   Recommendations for Services/Supports/Treatments: ***  Follow up Plan: A follow-up was scheduled to create a treatment plan and begin treatment. Therapist answered  and all questions during the evaluation and  contact information was provided.   Javier Leonard P. Javier Leonard, MSW, LCSW [employer] Therapist Main Office: [phone]    Individualized Treatment Plan  Strengths: ***  Supports: ***   Goal/Needs for Treatment:  In order of importance to patient 1) *** 2) *** 3) ***   Client Statement of Needs: ***   Treatment Level:***  Symptoms:***  Client Treatment Preferences:***   Healthcare consumer's goal for treatment:   Healthcare consumer will: Actively participate in therapy, working towards healthy functioning.    *Justification for Continuation/Discontinuation of Goal: R=Revised, O=Ongoing, A=Achieved, D=Discontinued  Goal 1)  Target Date Goal Was reviewed Status Code Progress towards goal/Likert rating                  Goal 2)  Target Date Goal Was reviewed Status Code Progress towards goal/Likert rating                  Goal 3)  Target Date Goal Was reviewed Status Code Progress towards goal/Likert rating                  Electronic signature sign off request for treatment plan sent via MyChart on ***  This plan has been reviewed and created by the following participants:  *** This plan will be reviewed at least every 12 months. Date Behavioral Health Clinician Date Guardian/Patient

## 2024-03-16 NOTE — BH Specialist Note (Signed)
 [facility] Collaborative Care Clinician attempted to connect with patient for scheduled appointment (virtual) Patient did not show up for the appointment. After waiting 15 minutes, clinician attempted to reach pt by phone to offer phone visit.   Per [facility] policy visit will be coded as no show

## 2024-03-23 ENCOUNTER — Ambulatory Visit: Admitting: Family Medicine

## 2024-03-24 ENCOUNTER — Ambulatory Visit: Payer: Self-pay | Admitting: Family Medicine

## 2024-03-24 ENCOUNTER — Other Ambulatory Visit: Payer: Self-pay

## 2024-03-24 DIAGNOSIS — H538 Other visual disturbances: Secondary | ICD-10-CM

## 2024-03-27 ENCOUNTER — Other Ambulatory Visit: Payer: Self-pay

## 2024-03-27 ENCOUNTER — Emergency Department (HOSPITAL_COMMUNITY): Payer: Worker's Compensation

## 2024-03-27 ENCOUNTER — Emergency Department (HOSPITAL_COMMUNITY)
Admission: EM | Admit: 2024-03-27 | Discharge: 2024-03-27 | Disposition: A | Discharge status: Home / Self Care | Payer: Worker's Compensation | Attending: Emergency Medicine | Admitting: Emergency Medicine

## 2024-03-27 DIAGNOSIS — S61111A Laceration without foreign body of right thumb with damage to nail, initial encounter: Secondary | ICD-10-CM | POA: Insufficient documentation

## 2024-03-27 DIAGNOSIS — W260XXA Contact with knife, initial encounter: Secondary | ICD-10-CM | POA: Diagnosis not present

## 2024-03-27 DIAGNOSIS — S6991XA Unspecified injury of right wrist, hand and finger(s), initial encounter: Secondary | ICD-10-CM | POA: Diagnosis present

## 2024-03-27 MED ORDER — CEPHALEXIN 500 MG PO CAPS: 500.0000 mg | ORAL_CAPSULE | Freq: Four times a day (QID) | ORAL | 0 refills | Status: AC

## 2024-03-27 MED ORDER — METHOCARBAMOL 500 MG PO TABS: 500.0000 mg | ORAL_TABLET | Freq: Two times a day (BID) | ORAL | 0 refills | Status: AC

## 2024-03-27 MED ORDER — CEPHALEXIN 500 MG PO CAPS
500.0000 mg | ORAL_CAPSULE | Freq: Once | ORAL | Status: AC
  Administered 2024-03-27: 500 mg via ORAL
  Filled 2024-03-27: qty 1

## 2024-03-27 MED ORDER — OXYCODONE-ACETAMINOPHEN 5-325 MG PO TABS
1.0000 | ORAL_TABLET | Freq: Once | ORAL | Status: AC
  Administered 2024-03-27: 1 via ORAL
  Filled 2024-03-27: qty 1

## 2024-03-27 NOTE — Discharge Instructions (Addendum)
 You were seen  in the ER today for concerns of a cut to your right thumb. This did not appear to strike the bone and has cleaned and wrapped here in the ER. Based on the location of this injury, your nail as not removed today and stitches were not placed. I have started you on a course of antibiotics and pain medications to try to control your symptoms. Please take these as prescribed. Follow up with your primary care provider or follow up with orthopedics/hand surgery if you feel that you are not having adequate healing or pain control within the next week. Return to the ER for concerns of new worsening symptoms such as infection or uncontrollable pain.

## 2024-03-27 NOTE — ED Provider Notes (Signed)
 [place] EMERGENCY DEPARTMENT AT [facility] Provider Note   CSN: [id number] Arrival date & time: 03/27/24  1813     Patient presents with: Laceration   Javier Leonard is a 38 y.o. male.  Patient presents to the emergency department today with concerns of finger laceration.  Reports that he was working and he cut his left thumb with a knife and feels that he has a cut under his fingernail into the nailbed.  Reports he split the fingernail and has a small cut to the lateral aspect of the left thumb.  Denies any difficulty with movement at the distal end of the thumb.  No reported numbness or tingling.  Tdap up-to-date with last tetanus given on 11/27/2023.   Laceration      Prior to Admission medications   Medication Sig Start Date End Date Taking? Authorizing Provider  cephALEXin (KEFLEX) 500 MG capsule Take 1 capsule (500 mg total) by mouth 4 (four) times daily for 7 days. 03/27/24 04/03/24 Yes Dalbert Stillings A, PA-C  methocarbamol (ROBAXIN) 500 MG tablet Take 1 tablet (500 mg total) by mouth 2 (two) times daily. 03/27/24  Yes Pieter Fooks A, PA-C  FLUoxetine  (PROZAC ) 20 MG capsule Take 1 capsule (20 mg total) by mouth at bedtime. 11/27/23   Petrina Pries, NP  hydrOXYzine  (ATARAX ) 10 MG tablet Take 1 tablet (10 mg total) by mouth 3 (three) times daily as needed for anxiety. 03/06/24 07/04/24  Petrina Pries, NP    Allergies: Blueberry flavor [flavoring agent] and Dust mite extract    Review of Systems  Skin:  Positive for wound.  All other systems reviewed and are negative.   Updated Vital Signs BP (!) 125/108   Pulse 84   Temp 98.4 F (36.9 C) (Oral)   Resp (!) 22   Ht 6' 2 (1.88 m)   Wt 72.6 kg   SpO2 97%   BMI 20.54 kg/m   Physical Exam Vitals and nursing note reviewed.  Constitutional:      General: He is not in acute distress.    Appearance: He is well-developed.  HENT:     Head: Normocephalic and atraumatic.  Eyes:     Conjunctiva/sclera: Conjunctivae  normal.  Cardiovascular:     Rate and Rhythm: Normal rate and regular rhythm.     Heart sounds: No murmur heard. Pulmonary:     Effort: Pulmonary effort is normal. No respiratory distress.     Breath sounds: Normal breath sounds.  Abdominal:     Palpations: Abdomen is soft.     Tenderness: There is no abdominal tenderness.  Musculoskeletal:        General: No swelling.     Cervical back: Neck supple.  Skin:    General: Skin is warm and dry.     Capillary Refill: Capillary refill takes less than 2 seconds.     Findings: Lesion present.     Comments: Left thumb lesion through the distal nail with no signs of active bleeding.  Neurological:     Mental Status: He is alert.  Psychiatric:        Mood and Affect: Mood normal.     (all labs ordered are listed, but only abnormal results are displayed) Labs Reviewed - No data to display  EKG: None  Radiology: DG Finger Thumb Right Result Date: 03/27/2024 CLINICAL DATA:  Finger laceration EXAM: RIGHT THUMB 2+V COMPARISON:  X-ray right hand 04/03/2021 FINDINGS: There is no evidence of fracture or dislocation. There is no  evidence of arthropathy or other focal bone abnormality. Soft tissues are unremarkable. No retained radiopaque foreign body. IMPRESSION: 1. No acute displaced fracture or dislocation. 2. No retained radiopaque foreign body. Electronically Signed   By: Morgane  Naveau M.D.   On: 03/27/2024 19:04    Procedures   Medications Ordered in the ED  oxyCODONE-acetaminophen (PERCOCET/ROXICET) 5-325 MG per tablet 1 tablet (1 tablet Oral Given 03/27/24 1840)  cephALEXin (KEFLEX) capsule 500 mg (500 mg Oral Given 03/27/24 2200)                                   Medical Decision Making Amount and/or Complexity of Data Reviewed Radiology: ordered.  Risk Prescription drug management.   This patient presents to the ED for concern of finger laceration.  Differential diagnosis includes thumb laceration, nail avulsion, tuft  fracture, finger dislocation, contaminated wound   Imaging Studies ordered:  I ordered imaging studies including x-ray of the right thumb I independently visualized and interpreted imaging which showed negative for acute findings I agree with the radiologist interpretation   Medicines ordered and prescription drug management:  I ordered medication including Percocet, Keflex for pain, wound prophylaxis Reevaluation of the patient after these medicines showed that the patient proved I have reviewed the patients home medicines and have made adjustments as needed   Problem List / ED Course:  Patient without symptoms medical history presents emergency department concerns of laceration.  Reports that his fingernail split while he was cooking bacon/cutting bacon and sustained a laceration to the right thumb.  Reports bleeding controlled before arriving.  Tdap up-to-date.  Denies any difficulty with movement of the finger but does endorse pain in this area. On exam, the wound does not appear to be grossly contaminated but there is a laceration through the distal portion of the right thumb nail.  There does not appear to be much involvement with the skin itself. X-ray of the right thumb is negative for any acute findings. Finger was thoroughly cleaned and dressed here in the ED. Given area and after discussion with patient, decision made to not remove distal portion of nail as it is still attached to finger. Advised close follow up with PCP and hand surgery. Started on Keflex to prevent complications of infection given nature or laceration. Discharged home in stable condition.   Social Determinants of Health:  None  Final diagnoses:  Laceration of right thumb without foreign body with damage to nail, initial encounter    ED Discharge Orders          Ordered    cephALEXin (KEFLEX) 500 MG capsule  4 times daily        03/27/24 2130    methocarbamol (ROBAXIN) 500 MG tablet  2 times daily         03/27/24 2130               Lenford Beddow A, PA-C 03/27/24 2233    Patt Alm Macho, MD 03/27/24 2259

## 2024-03-27 NOTE — ED Notes (Signed)
 Patient transported to X-ray

## 2024-03-27 NOTE — ED Triage Notes (Signed)
 Pt came in after splitting his fingernail on his left thumb while cooking bacon. Bleeding is under control.

## 2024-03-30 ENCOUNTER — Encounter: Payer: Self-pay | Admitting: Podiatry

## 2024-03-30 ENCOUNTER — Ambulatory Visit: Admitting: Podiatry

## 2024-03-30 ENCOUNTER — Ambulatory Visit (INDEPENDENT_AMBULATORY_CARE_PROVIDER_SITE_OTHER)

## 2024-03-30 DIAGNOSIS — R609 Edema, unspecified: Secondary | ICD-10-CM | POA: Diagnosis not present

## 2024-03-30 DIAGNOSIS — M2041 Other hammer toe(s) (acquired), right foot: Secondary | ICD-10-CM

## 2024-03-30 DIAGNOSIS — L03115 Cellulitis of right lower limb: Secondary | ICD-10-CM | POA: Diagnosis not present

## 2024-03-30 MED ORDER — AMOXICILLIN-POT CLAVULANATE 875-125 MG PO TABS: 1.0000 | ORAL_TABLET | Freq: Two times a day (BID) | ORAL | 0 refills | Status: AC

## 2024-03-30 NOTE — Progress Notes (Signed)
 Patient presents today with similar complaint of pain between the 4th and 5th toes on the right foot.  Says its gotten better and worse at times.  Has not noticed any drainage.  Has not noticed any redness and does not recall any injury to the foot.  No fever or chills or nausea or vomiting.   Physical exam:  General appearance: Pleasant, and in no acute distress. AOx3.  Vascular: Pedal pulses: DP 2/4 bilaterally, PT 2/4 bilaterally.  No edema lower legs bilaterally. Capillary fill time immediate bilaterally.  Neurological: Light touch intact feet bilaterally.  Normal Achilles reflex bilaterally.  No clonus or spasticity noted.   Dermatologic:   Skin normal temperature bilaterally.  Skin normal color, tone, and texture bilaterally.   Musculoskeletal: Tenderness in the interdigital space between fourth fifth toes right.  Tenderness between the 4th and 5th metatarsal heads just in the early distal edge of the intermetatarsal space.  There are some thickened tissue in the interspace.  No distinct abscess or deep tract noted.  Some redness around this area.  Some localized swelling also.  Radiographs: 3 views foot right: Hammertoe deformity 4th and 5th toes.  Denies any fractures or dislocations.  No osteomyelitis.  No subcutaneous emphysema.  No soft tissue densities noted.  Diagnosis: 1.  Cellulitis foot right. 2.  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 - Discussed with him the pain in the foot and probable cellulitis.  Appears that he might be developing a cellulitis and infection in the interdigital and possibly intermetatarsal space on the right foot.  Will give him oral antibiotics to take and have him soak it.  Could also be just an inflamed bursitis from the hammertoes. -Rx Augmentin 8 AceNic 5 mg 1 p.o. twice daily for 10 days -Dispensed surgical shoe right -If soak instructions soak twice daily warm Epsom salt  water 15 minutes Return 2 weeks possible cellulitis right foot

## 2024-04-04 ENCOUNTER — Ambulatory Visit: Admitting: Podiatry

## 2024-04-12 ENCOUNTER — Ambulatory Visit (HOSPITAL_BASED_OUTPATIENT_CLINIC_OR_DEPARTMENT_OTHER): Payer: Self-pay | Admitting: Student

## 2024-04-12 ENCOUNTER — Ambulatory Visit: Payer: MEDICAID | Admitting: Podiatry

## 2024-04-12 ENCOUNTER — Encounter: Payer: Self-pay | Admitting: Podiatry

## 2024-04-12 ENCOUNTER — Encounter (HOSPITAL_BASED_OUTPATIENT_CLINIC_OR_DEPARTMENT_OTHER): Payer: Self-pay

## 2024-04-12 DIAGNOSIS — L03115 Cellulitis of right lower limb: Secondary | ICD-10-CM | POA: Diagnosis not present

## 2024-04-12 NOTE — Progress Notes (Signed)
 Patient presents follow-up cellulitis right foot and the fourth webspace.  Doing much better very little pain at this point.  Just little bit of soreness swelling is down and redness has resolved and he has a lot of peeling skin he said.  No fever chills nausea or vomiting.   Physical exam:  General appearance: Pleasant, and in no acute distress. AOx3.  Vascular: Pedal pulses: DP 2 /4 bilaterally, PT 2/4 bilaterally.  Minimal edema lower legs bilaterally. Capillary fill time needed bilaterally.  Neurological: Grossly intact bilaterally  Dermatologic:   Healing skin with decreased swelling in the fourth webspace right as well as the dorsal aspect of the fourth IMS.  No erythema or open lesions noted.  No drainage noted.  Musculoskeletal: Severe hammertoe 4th and 5th toe with some soreness on the medial aspect of the PIPJ of the fifth toe.   Diagnosis: 1.  Cellulitis right foot.-Resolving and healing well.  Plan: -Established office visit for evaluation and management.  Level 3. -Discussed with him the cellulitis in the foot has responded well to the soaking and the antibiotics.  Will [place] soaking twice a day 15 minutes for another week and finish the antibiotics.  Should be pretty much resolved by then.  I told him that he might have a little bit of soreness lingering for a few more weeks.  Watch for any signs of worsening again or developing an infection again. -Discussed with him that if this flares up again or starts getting painful to call us  earlier if not let it get as bad as it did last time.  If it does become painful again probably surgery  recommended for the hammertoe(s)  Return as needed

## 2024-04-19 ENCOUNTER — Telehealth: Payer: Self-pay

## 2024-04-19 ENCOUNTER — Ambulatory Visit: Payer: Self-pay

## 2024-04-19 ENCOUNTER — Telehealth: Payer: Self-pay | Admitting: Family Medicine

## 2024-04-19 NOTE — Telephone Encounter (Signed)
 Patient refused triage and verbally abusive language to nurse.  Patient states I need help and y'all in my way. I need an appt with Christina.  Nurse asked patient if he feels like hurting himself or others, patient refused to answer question.  Nurse called [facility], spoke with Jaquelyn Jaquelyn states okay to transfer call over to assist with appointment.  Nurse transferred call.

## 2024-04-19 NOTE — Telephone Encounter (Signed)
 Unable to complete triage due to patients foul language, anger and cursing. He is wanting to be scheduled with a provider for mental health concerns; will someone follow up with patient.             Copied from CRM [id number]. Topic: Clinical - Red Word Triage >> Apr 19, 2024  3:21 PM Wess RAMAN wrote: Red Word that prompted transfer to Nurse Triage: Patient states he is at his breaking point and needs an emergency appt with [facility] Answer Assessment - Initial Assessment Questions 1. CONCERN: What happened that made you call today?       2. DEPRESSION SYMPTOM SCREENING: How are you feeling overall? (e.g., decreased energy, increased sleeping or difficulty sleeping, difficulty concentrating, feelings of sadness, guilt, hopelessness, or worthlessness)       3. RISK OF HARM - SUICIDAL IDEATION:  Do you ever have thoughts of hurting or killing yourself?  (e.g., yes, no, no but preoccupation with thoughts about death)       4. RISK OF HARM - HOMICIDAL IDEATION:  Do you ever have thoughts of hurting or killing someone else?  (e.g., yes, no, no but preoccupation with thoughts about death)       5. FUNCTIONAL IMPAIRMENT: How have things been going for you overall? Have you had more difficulty than usual doing your normal daily activities?  (e.g., better, same, worse; self-care, school, work, interactions)       6. SUPPORT: Who is with you now? Who do you live with? Do you have family or friends who you can talk to?        7. THERAPIST: Do you have a counselor or therapist? If Yes, ask: What is their name?       8. STRESSORS: Has there been any new stress or recent changes in your life?       9. ALCOHOL USE OR SUBSTANCE USE (DRUG USE): Do you drink alcohol or use any illegal drugs?       10. OTHER: Do you have any other physical symptoms right now? (e.g., fever)  Protocols used: Depression-A-AH

## 2024-04-19 NOTE — Telephone Encounter (Signed)
 Pt called irate using profanity and making threats wanting forms signed to be able to sale plasma. Appt was made for behavioral health

## 2024-04-19 NOTE — Telephone Encounter (Signed)
 I spoke with patient he was very irate and using profanity. He stated he was tired of repeating himself and asking why we do not communicate with each other. He kept saying he needs to speak with his therapist. Appt scheduled with Christina. YL,RMA

## 2024-04-20 ENCOUNTER — Telehealth: Payer: Self-pay | Admitting: Family Medicine

## 2024-04-20 ENCOUNTER — Telehealth: Payer: Self-pay | Admitting: Licensed Clinical Social Worker

## 2024-04-20 NOTE — Telephone Encounter (Addendum)
 Left HIPAA compliant voice mail message that [facility] clinician would not be on site on 04/21/24 and that appointment will be changed to virtual.

## 2024-04-20 NOTE — Telephone Encounter (Signed)
 POLICE WAS CALLED DUE TO PT BEING IRATE AND COMMUNICATING THREATS. POLICE TOOK STATEMENTS AND SAT IN THE PARKING LOT UNTIL EVERYONE MADE IT TO CAR SAFE. OFFICER NAME E.J. GENELL BROCCOLI

## 2024-04-21 ENCOUNTER — Ambulatory Visit: Payer: MEDICAID | Admitting: Licensed Clinical Social Worker

## 2024-04-21 DIAGNOSIS — F4323 Adjustment disorder with mixed anxiety and depressed mood: Secondary | ICD-10-CM

## 2024-04-21 DIAGNOSIS — Z91199 Patient's noncompliance with other medical treatment and regimen due to unspecified reason: Secondary | ICD-10-CM

## 2024-04-21 NOTE — BH Specialist Note (Signed)
 [facility] Clinician attempted to connect with patient for scheduled virtual appointment via Caregility. Patient was sent text x 2 and email x 2.  Pt did not connect in virtual room--after 15 minutes, clinician closed out Caregility virtual room.   Clinician attempted to connect w/ patient via phone x 2--clinician left HIPAA compliant voice mail confirming multiple attempts to reach patient for appointment scheduled today.   IBH clinician provided pt with community resources + psych resources. IBH clinician made internal referral to [facility]) for psychiatry services.

## 2024-04-21 NOTE — Patient Instructions (Addendum)
 24/7 Behavioral Health Crisis Centers by [place]:  [place] :  [place]: [facility] 24/7 Crisis Line: [phone] Access to Care Line [phone]  [facility]  [address] [place], [place] [phone] (call to confirm hours)  [facility]: [facility] 24/7 Crisis Line: [phone] Access to Care Line [phone] [facility]: [facility] Mental [facility] Outpatient: [phone]   [address] [place], [place] [address]  [facility]: [facility] Recovery Services (24/7): [phone]   [address] [place], [place] [address]  [facility]: [facility] (24/7): [phone]   [address] [place], [place] [address] - 24 Hour Crisis Line: [phone]  [facility]: - [facility] Urgent Care (24/7): [phone]   [address] [place], [place] [address] - [place] 24 Hour Crisis Line: [phone]  [facility] (24/7): [phone] [address] [place]. [place], [place] [address] - 24 Hour Crisis Line: [phone]  H. Rivera [place]:  -[facility] [phone]  ______________________________________________  [facility] at [place] Location: [address], [place], [place] Services: Psychiatry, therapy, medication management Phone: [phone]  [facility] Location: [address] [place], [place], [place] Services: Psychiatric evaluations, medication, psychotherapy Phone: [phone]  [facility] Location: [address], [place], [place] Services: Inpatient and outpatient psychiatry, addiction recovery  [facility] Location: [address] [place], [place] Services: Psychiatry, Spravato  (esketamine) for treatment-resistant depression Phone: [phone]  [facility] Location: [address], [place], [place] Services: Adult and child psychiatry, crisis care Phone: [phone]  [facility] Services: Therapy, psychiatry, coaching Address: [address] [place] [address] Phone: [phone]  [facility] Services: Psychiatry, DBT, VALERO ENERGY, natural treatments Phone: [phone]  [facility] Location: [address], [place], [place] Services: Outpatient therapy, MAT, ACTT, medication management Contact: [phone]  _______________________________________________  Advocacy/Legal Legal Aid [place]:  [phone]  /  [phone] /  LVM, taking clients  Family Justice Center:  [phone] /  Onsite, counseling with [place] is virtual, Accepting new clients   Family Service of the [employer] 24-hr Crisis line:  [phone] Virtual & Onsite services (Client preference), Accepting New clients  [employer], [place]:  [phone] Virtual & Onsite services (Client preference), Accepting New clients  Court Watch (custody):  [phone] Virtual, Accepting new clients  [employer] [facility]:   [phone] Virtual/Telephone, accepting clients for waitlisting (time depends on services)   Baby & Breastfeeding [place] [facility] [phone] Outpatient consultant out for weeks (will be hard to get an appointment) , Support group offered Virtually (Accepting new members)  [facility] Lactation [phone] Telephone & Onsite services (Client preference), Accepting New clients  WIC: [phone] ([place]);  [phone] ([place]) Virtual  [place] League:  [phone]     Childcare [place] [facility] Development: [phone] ([place]) / [phone] ([place])             - Child Care Resources/ Referrals/ Scholarships             - Head Start/  Early Head Start (call or apply online) Virtually (by appointment), Accepting new families  [place] DHHS: [place] Pre-K :  [phone] / [phone]  Employment / Job Secretary/administrator of [place]: [phone] / [address] Human Resources Officer (Client preference), Accepting New clients  [employer] (JobLink): [phone] ([place]) / [phone] ([place]) Virtual & Onsite workshops, Accepting new clients  [facility] [employer] [facility] Technician Resource/ Career Center: [phone] / [phone] Virtual & Onsite , Accepting New clients  [facility] Job & Career Center: [phone]   DHHS Work First: [phone] ([place]) / [phone] ([place]) Virtually, Accepting clients     Financial Assistance [place] [facility]:  216-518-8236 Financial assistance & other services  [employer]: [phone]   Javier Network (furniture):  4325965856   [facility] Helping Hands: 817-511-0218   Low Income Energy Assistance: (939)333-1000     Food Assistance DHHS- SNAP/ Food Stamps: 279-551-8597   WIC: LORRY626-691-6176 ;  [place] 820-070-5797     General Health / Clinics (Adults) Orange Card (for Adults) through [facility]: [phone]) [phone]    [facility]:   [phone]   [facility] Health & Wellness:   [phone]   Health Department:  [phone]   Planned Parenthood of [place]:   [phone] Onsite  [facility]:   [phone] Onsite     Housing [place] Housing Coalition:   (854)686-1090   [facility] [place] Housing Authority:  (715) 201-6379   Affordable Housing Managemnt:  [phone]     Immigrant/ Refugee Center for [facility]):  6265940280 Onsite, Accepting new people  [facility]:  779-780-1035 Virtual, accepting new individuals  [employer] [facility]:  506-477-1338 Onsite & Virtual, Accepting new individuals  [facility] Services:  832-361-4525 Virtual, Accepting new  clients  African Services Coalition:  715-381-4100     LGBTQ Youth SAFE  www.youthsafe[place].org  Virtual, Accepting new members  PFLAG  701 267 6615 / info@[place] .org  Virtual, Accepting new Members  [facility]:  213-616-1188  Virtual    Mental Health/ Substance Use Family Service of the [facility]  [phone] [address] [place]  [address] [address] Virtual & Onsite services (Client preference), Accepting New clients  [facility]:  ([phone] or [phone] [address] [place], [place] [address] Onsite & Virtual, Accepting new clients  [facility]:  [phone] [address] Suite [place], [place] [address] Virtual & Onsite, Accepting new clients  [facility]:  [phone] [place], [place]) [address] [place], [place] [address] Onsite & Virtual, Accepting new clients  [place] (walk-ins)  ([phone] / [address]   [place]:  9406001647 Virtual meetings via Zoom- need meeting passcode- call [phone] to receive code AFG= [place] Family Group  [email or web address]   Alcoholics Anonymous:  (530)676-9092 Tonerproviders.com.cy  Narcotics Anonymous:  657-615-1525 24 hour helpline: (423)207-6670  Quit Smoking Hotline:  800-QUIT-NOW (972) 577-0300)     COUNSELING AGENCIES in [place] (Accepting Medicaid)   Mental Health  (* = Spanish available;  + = Psychiatric services) * Family Service of the [facility]                                [phone] [address] [place], [place] [address] Virtual & Onsite services (Client preference), Accepting New clients  *+ [place] [facility]:                                        878-145-4262 or [phone] Virtual &  Onsite, Accepting clients  +[facility] Total Access Care                                [phone]   [facility]:                                                 5030622486   + [facility]:                                            807-684-2276 Onsite & Virtual, Accepting new clients  [facility]                               [phone] Onsite, Accepting new clients  * Family Solutions:                                                     (830)[phone] Virtual, NOT accepting new clients  The Social Emotional Learning (SEL) Group           ([phone]) [phone] Virtual, accepting new clients  [facility]:                                        [phone] Onsite & Virtual, Waitlist 6-8 months for services  [place] [facility] Consortium:                             442-503-3423   *[facility]                                                [phone] Onsite & Virtual, Accepting new clients  + [facility]:             (626)510-2634 or (210) [phone]   [facility]                                              [phone] Onsite & Virtual, Accepting new clients  Youth Unlimited (PCIT)                                              867-705-6150 Onsite & Virtual, At Capacity (check in occasionally , subject to change)    Substance Use [place]:  (361) [phone]  Alcoholics Anonymous:      606-520-3893  Narcotics Anonymous:       201-128-4467  Quit Smoking Hotline:         800-QUIT-NOW 901-272-5983)     Parenting Children's Home Society:  [phone] Virtual , Accepting families  YWCA: 762-373-6793   UNCG: Bringing Out the Best:  319-427-0635              Thriving at Three (Hispanic families): (605)021-2271 Onsite, Accepting new children ( short wait list)  Healthy Start (Family Service of the [place]):  (308)745-9427 x2288   Parents as Teachers:  (807) [phone] Virtually, accepting families ( waitlist for Spanish speaking families )  [place] Child Counsellor- Learning Together (Immigrants): 613-009-2969     Poison Control 636-143-4478   Sports & Recreation [employer] Open Doors Application:  https://www.rich.com/ Onsite, Accepting new families  [place] of [place] Recreation Centers: http://www.[place].gov/index.aspx?page=3615 Onsite    Special Needs Family Support Network:  520 068 9574 Virtual, Accepting new families  Autism Society of [place]:   (708) 728-3372 740-191-4771 or (319)058-3778 /  (865)435-1633 Virtual, Accepting new families   [facility]:  864-204-1863 Virtual, Accepting families  ARC of [place]:  435-758-5771 Virtual, Accepting new families  Children's Developmental Service Agency (CDSA):  470-141-0508 Virtual, Accepting new families    Transportation Medicaid Transportation: 540-725-5544 to apply  [place] Authority: 262-047-8081 (reduced-fare bus ID to Medicaid/ Medicare/ Orange Card)  SCAT Paratransit services: Eligible riders only, call 610-155-5797 for application    Tutoring/ Mentoring [employer] Child Development Institute: 4703480558 No tutoring only afterschool programming (In Person)  Big Brothers/ Big Sisters: (815)886-0206 [place])  [phone] ([place])   ACES through child's school: 5036181939   [employer] Achievers: contact your local Y In Person, Accepting New students   Updated 11/2022   __________________________________________________________

## 2024-04-25 ENCOUNTER — Encounter: Payer: Self-pay | Admitting: Radiology

## 2024-04-26 ENCOUNTER — Ambulatory Visit: Payer: MEDICAID | Admitting: Podiatry

## 2024-04-27 ENCOUNTER — Encounter: Payer: Self-pay | Admitting: Family Medicine

## 2024-04-27 ENCOUNTER — Telehealth (INDEPENDENT_AMBULATORY_CARE_PROVIDER_SITE_OTHER): Payer: MEDICAID | Admitting: Family Medicine

## 2024-04-27 VITALS — BP 120/80 | HR 74 | Ht 74.0 in | Wt 160.0 lb

## 2024-04-27 DIAGNOSIS — Z5900 Homelessness unspecified: Secondary | ICD-10-CM | POA: Diagnosis not present

## 2024-04-27 DIAGNOSIS — F411 Generalized anxiety disorder: Secondary | ICD-10-CM | POA: Diagnosis not present

## 2024-04-27 DIAGNOSIS — F331 Major depressive disorder, recurrent, moderate: Secondary | ICD-10-CM | POA: Diagnosis not present

## 2024-04-27 MED ORDER — FLUOXETINE HCL 40 MG PO CAPS: 40.0000 mg | ORAL_CAPSULE | Freq: Every day | ORAL | 1 refills | Status: AC

## 2024-04-27 NOTE — Progress Notes (Signed)
 Virtual Visit via Video Note  I,Jameka J Llittleton, CMA,acting as a scribe for [employer], NP.,have documented all relevant documentation on the behalf of [name], NP,as directed by  [name], NP while in the presence of [name], NP.  I connected with Javier Leonard on 04/27/24 at  8:20 AM EST by a video enabled telemedicine application and verified that I am speaking with the correct person using two identifiers.  Patient Location: Home Provider Location: Office/Clinic  I discussed the limitations, risks, security, and privacy concerns of performing an evaluation and management service by video and the availability of in person appointments. I also discussed with the patient that there may be a patient responsible charge related to this service. The patient expressed understanding and agreed to proceed.  Subjective: PCP: Creighton Bruna, NP  Chief Complaint  Patient presents with   Anxiety    Patient presents today anxiety follow up. He is currently taking the prozac  and hydroxyzine . He feels like the medication is not working for him.    Javier Leonard is a 38 year old male who presents for follow-up and medication evaluation for anxiety.  He takes hydroxyzine  and Prozac  for anxiety, noting that Prozac  has been somewhat helpful, particularly when he was working. However, he describes increased difficulty managing his anxiety since being out of work, feeling 'helpless' and 'hopeless'. He mentions a recent incident where he felt overwhelmed and needed to speak with a therapist, leading to a stressful interaction with the office staff.  He has been out of work for about a month due to a laceration on his finger, which he sustained at work. He went [facility] at [facility] for treatment. He notes some tenderness and missing skin at the site of the injury. He uses a muscle relaxer and Advil for pain management.  He is experiencing financial difficulties due to  being out of work and has been trying to help a friend by donating plasma, but was told he might have an autoimmune disease due to a birthmark, which he disputes. He is seeking a doctor's note to confirm his ability to return to work.  He is currently homeless and expresses difficulty managing early appointments due to his situation. He is seeking assistance with resources for homelessness and mentions needing a case manager to access certain services   Discussed the use of AI scribe software for clinical note transcription with the patient, who gave verbal consent to proceed.  History of Present Illness  .    ROS: Per HPI  Current Outpatient Medications:    amoxicillin-clavulanate (AUGMENTIN) 875-125 MG tablet, Take 1 tablet by mouth 2 (two) times daily., Disp: 20 tablet, Rfl: 0   FLUoxetine  (PROZAC ) 40 MG capsule, Take 1 capsule (40 mg total) by mouth daily., Disp: 90 capsule, Rfl: 1   hydrOXYzine  (ATARAX ) 10 MG tablet, Take 1 tablet (10 mg total) by mouth 3 (three) times daily as needed for anxiety., Disp: 90 tablet, Rfl: 3   methocarbamol (ROBAXIN) 500 MG tablet, Take 1 tablet (500 mg total) by mouth 2 (two) times daily., Disp: 20 tablet, Rfl: 0  Observations/Objective: Today's Vitals   04/27/24 0856  BP: 120/80  Pulse: 74  TempSrc: Oral  Weight: 160 lb (72.6 kg)  Height: 6' 2 (1.88 m)  PainSc: 0-No pain   Physical Exam Constitutional:      Appearance: Normal appearance.  HENT:     Head: Normocephalic.  Cardiovascular:     Rate and  Rhythm: Normal rate and regular rhythm.     Pulses: Normal pulses.     Heart sounds: Normal heart sounds.  Pulmonary:     Effort: Pulmonary effort is normal.     Breath sounds: Normal breath sounds.  Abdominal:     General: Bowel sounds are normal.  Neurological:     Mental Status: He is alert and oriented to person, place, and time. Mental status is at baseline.  Psychiatric:        Mood and Affect: Mood is anxious and depressed.      Assessment and Plan: GAD (generalized anxiety disorder) -     FLUoxetine  HCl; Take 1 capsule (40 mg total) by mouth daily.  Dispense: 90 capsule; Refill: 1 -     AMB Referral VBCI Care Management -     Ambulatory referral to Psychiatry  MDD (major depressive disorder), recurrent episode, moderate (HCC) -     FLUoxetine  HCl; Take 1 capsule (40 mg total) by mouth daily.  Dispense: 90 capsule; Refill: 1 -     AMB Referral VBCI Care Management -     Ambulatory referral to Psychiatry  Homeless -     AMB Referral VBCI Care Management    Follow Up Instructions: Return for keep scheduled appt.   I discussed the assessment and treatment plan with the patient. The patient was provided an opportunity to ask questions, and all were answered. The patient agreed with the plan and demonstrated an understanding of the instructions.   The patient was advised to call back or seek an in-person evaluation if the symptoms worsen or if the condition fails to improve as anticipated.  The above assessment and management plan was discussed with the patient. The patient verbalized understanding of and has agreed to the management plan.   I, [name], NP, have reviewed all documentation for this visit. The documentation on 04/27/2024 for the exam, diagnosis, procedures, and orders are all accurate and complete.

## 2024-05-03 ENCOUNTER — Other Ambulatory Visit: Payer: Self-pay

## 2024-05-03 DIAGNOSIS — H538 Other visual disturbances: Secondary | ICD-10-CM

## 2024-05-11 ENCOUNTER — Other Ambulatory Visit: Payer: Self-pay | Admitting: Family Medicine

## 2024-05-11 ENCOUNTER — Other Ambulatory Visit: Payer: MEDICAID

## 2024-05-11 DIAGNOSIS — L816 Other disorders of diminished melanin formation: Secondary | ICD-10-CM

## 2024-05-12 ENCOUNTER — Ambulatory Visit: Payer: Self-pay | Admitting: Family Medicine

## 2024-05-12 DIAGNOSIS — E559 Vitamin D deficiency, unspecified: Secondary | ICD-10-CM

## 2024-05-12 LAB — ANA W/REFLEX IF POSITIVE: Anti Nuclear Antibody (ANA): NEGATIVE

## 2024-05-12 LAB — VITAMIN D 25 HYDROXY (VIT D DEFICIENCY, FRACTURES): Vit D, 25-Hydroxy: 11.3 ng/mL — ABNORMAL LOW (ref 30.0–100.0)

## 2024-05-12 LAB — RNP ANTIBODIES: ENA RNP Ab: 0.2 AI (ref 0.0–0.9)

## 2024-05-12 MED ORDER — VITAMIN D (ERGOCALCIFEROL) 1.25 MG (50000 UNIT) PO CAPS: 50000.0000 [IU] | ORAL_CAPSULE | ORAL | 3 refills | Status: AC

## 2024-05-12 NOTE — Progress Notes (Signed)
 Your labs are negative for any autoimmune disease. Your vitamin D level is low, 50,000 units of vitamin D has been sent to the pharmacy. Take one caplet weekly .   Thank you!

## 2024-05-16 ENCOUNTER — Telehealth: Payer: Self-pay

## 2024-05-16 NOTE — Telephone Encounter (Signed)
 Copied from CRM [id number]. Topic: General - Other >> May 12, 2024  3:46 PM Delon HERO wrote: Reason for CRM: Patient is calling regarding letter to donate plasma needing name printed, title, and date.  Was sen to the wrong office. KH

## 2024-05-31 ENCOUNTER — Telehealth: Payer: Self-pay

## 2024-05-31 NOTE — Progress Notes (Signed)
 Complex Care Management Note  Care Guide Note 05/31/2024 Name: Javier Leonard MRN: [id number] DOB: 28-May-1986  Javier Leonard is a 38 y.o. year old male who sees Petrina Pries, NP for primary care. I reached out to [name] by phone today to offer complex care management services.  Javier Leonard was given information about Complex Care Management services today including:   The Complex Care Management services include support from the care team which includes your Nurse Care Manager, Clinical Social Worker, or Pharmacist.  The Complex Care Management team is here to help remove barriers to the health concerns and goals most important to you. Complex Care Management services are voluntary, and the patient may decline or stop services at any time by request to their care team member.   Complex Care Management Consent Status: Patient agreed to services and verbal consent obtained.   Follow up plan:  Telephone appointment with complex care management team member scheduled for:  06/24/24  Encounter Outcome:  Patient Scheduled  .Javier Leonard [facility], [facility] Guide  Direct Dial: [phone]  Fax [phone]

## 2024-06-24 ENCOUNTER — Telehealth: Payer: MEDICAID | Admitting: Licensed Clinical Social Worker

## 2024-06-24 ENCOUNTER — Encounter: Payer: Self-pay | Admitting: Licensed Clinical Social Worker

## 2024-06-24 NOTE — Patient Instructions (Signed)
 [name] - I am sorry I was unable to reach you today for our scheduled appointment. I work with Petrina Pries, NP and am calling to support your healthcare needs. Please contact me at [phone] at your earliest convenience. I look forward to speaking with you soon.   Thank you,  Rolin Kerns, LCSW [place]  [facility], [facility] Clinical Social Worker Direct Dial: [phone]  Fax: [phone] Website: [email or web address] 10:08 AM

## 2024-07-07 ENCOUNTER — Other Ambulatory Visit: Payer: Self-pay | Admitting: Licensed Clinical Social Worker

## 2024-07-07 DIAGNOSIS — F411 Generalized anxiety disorder: Secondary | ICD-10-CM

## 2024-07-07 DIAGNOSIS — Z5900 Homelessness unspecified: Secondary | ICD-10-CM

## 2024-07-07 DIAGNOSIS — F331 Major depressive disorder, recurrent, moderate: Secondary | ICD-10-CM

## 2024-07-08 NOTE — Patient Instructions (Signed)
 Visit Information  Thank you for taking time to visit with me today. Please don't hesitate to contact me if I can be of assistance to you before our next scheduled appointment.  Your next care management appointment is by telephone on 2/16 at 10:30  Please call the care guide team at [phone] if you need to cancel, schedule, or reschedule an appointment.   Please call the Suicide and Crisis Lifeline: 988 go to [facility] Urgent [facility] [address] [place] [phone]) call 911 if you are experiencing a Mental Health or Behavioral Health Crisis or need someone to talk to.  Rolin Kerns, LCSW [place]  [facility], [facility] Clinical Social Worker Direct Dial: [phone]  Fax: [phone] Website: [email or web address] 6:50 PM

## 2024-07-08 NOTE — Patient Outreach (Signed)
 Complex Care Management   Visit Note  07/07/2024  Name:  Javier Leonard MRN: [id number] DOB: 1985-08-08  Situation: Referral received for Complex Care Management related to Mental/Behavioral Health diagnosis MDD GAD I obtained verbal consent from Patient.  Visit completed with Patient  on the phone  Background:   Past Medical History:  Diagnosis Date   Allergy     Assessment: Patient Reported Symptoms:  Cognitive Cognitive Status: No symptoms reported, Alert and oriented to person, place, and time, Normal speech and language skills Cognitive/Intellectual Conditions Management [RPT]: None reported or documented in medical history or problem list   Health Maintenance Behaviors: Annual physical exam  Neurological Neurological Review of Symptoms: No symptoms reported    HEENT HEENT Symptoms Reported: No symptoms reported      Cardiovascular Cardiovascular Symptoms Reported: No symptoms reported    Respiratory Respiratory Symptoms Reported: No symptoms reported    Endocrine Endocrine Symptoms Reported: No symptoms reported    Gastrointestinal Gastrointestinal Symptoms Reported: No symptoms reported      Genitourinary Genitourinary Symptoms Reported: No symptoms reported    Integumentary Integumentary Symptoms Reported: Other Other Integumentary Symptoms: Dry Skin Skin Management Strategies: Coping strategies Skin Comment: Pt uses aquapher to assist symptom management  Musculoskeletal Musculoskelatal Symptoms Reviewed: No symptoms reported   Falls in the past year?: No Number of falls in past year: 1 or less Was there an injury with Fall?: No Fall Risk Category Calculator: 0 Patient Fall Risk Level: Low Fall Risk    Psychosocial       Quality of Family Relationships: involved Do you feel physically threatened by others?: No    07/08/2024    PHQ2-9 Depression Screening   Little interest or pleasure in doing things Nearly every day  Feeling down, depressed, or  hopeless Nearly every day  PHQ-2 - Total Score 6  Trouble falling or staying asleep, or sleeping too much Nearly every day  Feeling tired or having little energy Nearly every day  Poor appetite or overeating  Several days  Feeling bad about yourself - or that you are a failure or have let yourself or your family down Nearly every day  Trouble concentrating on things, such as reading the newspaper or watching television Nearly every day  Moving or speaking so slowly that other people could have noticed.  Or the opposite - being so fidgety or restless that you have been moving around a lot more than usual More than half the days  Thoughts that you would be better off dead, or hurting yourself in some way Not at all  PHQ2-9 Total Score 21  If you checked off any problems, how difficult have these problems made it for you to do your work, take care of things at home, or get along with other people    Depression Interventions/Treatment Referral to Psychiatry, Medication    There were no vitals filed for this visit.    Medications Reviewed Today     Reviewed by Paloma Grange D, LCSW (Social Worker) on 07/07/24 at 1116  Med List Status: <None>   Medication Order Taking? Sig Documenting Provider Last Dose Status Informant  amoxicillin -clavulanate (AUGMENTIN ) 875-125 MG tablet [id number]  Take 1 tablet by mouth 2 (two) times daily.  Patient not taking: Reported on 07/07/2024   Christine Rush, DPM  Active   FLUoxetine  (PROZAC ) 40 MG capsule [id number] Yes Take 1 capsule (40 mg total) by mouth daily. Petrina Pries, NP  Active   methocarbamol  (ROBAXIN ) 500 MG tablet [id number]  Yes Take 1 tablet (500 mg total) by mouth 2 (two) times daily. Zelaya, Oscar A, PA-C  Active   Vitamin D , Ergocalciferol , (DRISDOL ) 1.25 MG (50000 UNIT) CAPS capsule [id number]  Take 1 capsule (50,000 Units total) by mouth every 7 (seven) days.  Patient not taking: Reported on 07/07/2024   Petrina Pries, NP  Active              Recommendation:   Continue Current Plan of Care  Follow Up Plan:   Telephone follow-up in 1 month  Rolin Kerns, LCSW [facility]  [facility], [facility] Clinical Social Worker Direct Dial: [phone]  Fax: [phone] Website: [email or web address] 6:47 PM

## 2024-07-12 ENCOUNTER — Other Ambulatory Visit: Payer: Self-pay | Admitting: Licensed Clinical Social Worker

## 2024-07-12 NOTE — Patient Outreach (Signed)
 Social Drivers of Health  Community Resource and Care Coordination Visit Note   07/12/2024  Name: Javier Leonard MRN: [id number] DOB:Oct 25, 1985  Situation: Referral received for [facility] needs assessment and assistance related to [employer]  Food Insecurity  Depression  . I obtained verbal consent from Patient.  Visit completed with Patient on the phone.   Background:   SDOH Interventions Today    Flowsheet Row Most Recent Value  SDOH Interventions   Food Insecurity Interventions --  [did not want any food pantry list did educate on the [employer] app]  Transportation Interventions Intervention Not Indicated  Utilities Interventions Intervention Not Indicated     Assessment:   Goals Addressed             This Visit's Progress    BSW VBCI Social Work Care Plan       Current SDOH Barriers:  Financial constraints related to not being able to keep a job [employer] barriers Mental Health Concerns  Seeing LCSW for MH  Interventions: Patient interviewed and appropriate screenings performed Referred patient to community resources  Advised patient to continue to see the LCSW for MH  SW discussed food panty and patient declined, discussed housing and job and patient was very upset that the SW was asking questions          Recommendation:   attend all scheduled provider appointments Patient was a bit irritated with the SW with all the questions being asked today, He stated that he was stressed and can't keep a job due to his PTSD and his mental health. SW di discuss options for food and food pantries and the patient declined. Patient did state that he has been homeless for 3 years and he stays from couch to couch with family and friends and other times he will sleep at a 24 hour laundromat.    Follow Up Plan:   {VBCICCRCFOLLOWUP:33433}  SIG ***

## 2024-07-13 NOTE — Patient Instructions (Signed)
 Visit Information  Thank you for taking time to visit with me today. Please don't hesitate to contact me if I can be of assistance to you before our next scheduled appointment.  Our next appointment is by telephone on 07/29/2024 at 1:00 pm Please call the care guide team at [phone] if you need to cancel or reschedule your appointment.   Following is a copy of your care plan:   Goals Addressed             This Visit's Progress    BSW VBCI Social Work Care Plan       Current SDOH Barriers:  Financial constraints related to not being able to keep a job Cytogeneticist barriers Mental Health Concerns  Seeing LCSW for MH  Interventions: Patient interviewed and appropriate screenings performed Referred patient to community resources  Advised patient to continue to see the LCSW for MH  SW discussed food panty and patient declined, discussed housing and job and patient was very upset that the SW was asking questions          Please call the Suicide and Crisis Lifeline: 988 go to [facility] [address], [place] [phone]) call 911 if you are experiencing a Mental Health or Behavioral Health Crisis or need someone to talk to.  Patient verbalized understanding of Care plan and visit instructions communicated this visit Javier Leonard Javier Leonard Javier HEDWIG, PhD [facility], [facility] Social Worker Direct Dial: [phone]  Fax: [phone]

## 2024-07-29 ENCOUNTER — Other Ambulatory Visit: Payer: Self-pay | Admitting: Licensed Clinical Social Worker

## 2024-07-29 NOTE — Patient Instructions (Signed)
 Visit Information  Thank you for taking time to visit with me today. Please don't hesitate to contact me if I can be of assistance to you before our next scheduled appointment.  Your next care management appointment is by telephone on 08/15/2024 at 1:00 pm   Please call the care guide team at [phone] if you need to cancel, schedule, or reschedule an appointment.   Please call the Suicide and Crisis Lifeline: 988 go to [facility] [address] [place] [phone]) call 911 if you are experiencing a Mental Health or Behavioral Health Crisis or need someone to talk to.  Tobias CHARM Maranda HEDWIG, PhD [facility], [facility] Social Worker Direct Dial: [phone]  Fax: [phone]

## 2024-07-29 NOTE — Patient Outreach (Signed)
 Social Drivers of Health  Community Resource and Care Coordination Visit Note   07/29/2024  Name: Javier Leonard MRN: [id number] DOB:02-Aug-1985  Situation: Referral received for [facility] needs assessment and assistance related to Housing . I obtained verbal consent from Patient.  Visit completed with Patient on the phone.   Background:      Assessment:   Goals Addressed             This Visit's Progress    BSW VBCI Social Work Care Plan   On track    Current SDOH Barriers:  Financial constraints related to not being able to keep a job [employer] barriers Mental Health Concerns  Seeing LCSW for MH  Interventions: Patient interviewed and appropriate screenings performed Referred patient to community resources  Advised patient to continue to see the LCSW for MH  SW discussed food panty and patient declined, discussed housing and job and patient was very upset that the SW was asking questions          Recommendation:   attend all scheduled provider appointments follow up with resources provided ([employer]) regarding housing needs. Patient stated that he is currently staying at a friends house during the day watching the friends kids and doing things around the house in exchange for having somewhere to stay. The nights that he does not have anywhere to go he will go to a local laundromat and sleep there. Sw reminded patient about an upcoming appointment with the LCSW.   Follow Up Plan:   Telephone follow up appointment date/time:  08/15/2024 at 1:00 pm  Tobias CHARM Maranda HEDWIG, PhD [facility], [facility] Social Worker Direct Dial: [phone]  Fax: [phone]

## 2024-08-08 ENCOUNTER — Telehealth: Admitting: Licensed Clinical Social Worker

## 2024-08-12 ENCOUNTER — Ambulatory Visit (HOSPITAL_COMMUNITY): Payer: MEDICAID | Admitting: Psychiatry

## 2024-08-15 ENCOUNTER — Other Ambulatory Visit: Admitting: Licensed Clinical Social Worker
